# Patient Record
Sex: Female | Born: 1965 | Race: Black or African American | Hispanic: No | Marital: Single | Smoking: Current some day smoker
Health system: Southern US, Community
[De-identification: ages and names within clinical notes are randomized; demographics above are authoritative.]

## PROBLEM LIST (undated history)

## (undated) DIAGNOSIS — B20 Human immunodeficiency virus [HIV] disease: Principal | ICD-10-CM

## (undated) DIAGNOSIS — F339 Major depressive disorder, recurrent, unspecified: Secondary | ICD-10-CM

## (undated) DIAGNOSIS — I10 Essential (primary) hypertension: Secondary | ICD-10-CM

## (undated) DIAGNOSIS — M179 Osteoarthritis of knee, unspecified: Secondary | ICD-10-CM

## (undated) DIAGNOSIS — F3132 Bipolar disorder, current episode depressed, moderate: Secondary | ICD-10-CM

## (undated) DIAGNOSIS — M1710 Unilateral primary osteoarthritis, unspecified knee: Secondary | ICD-10-CM

## (undated) HISTORY — DX: Osteoarthritis of knee, unspecified: M17.9

## (undated) HISTORY — DX: Human immunodeficiency virus (HIV) disease: B20

## (undated) HISTORY — DX: Bipolar disorder, current episode depressed, moderate: F31.32

## (undated) HISTORY — DX: Major depressive disorder, recurrent, unspecified: F33.9

## (undated) HISTORY — DX: Unilateral primary osteoarthritis, unspecified knee: M17.10

## (undated) HISTORY — PX: APPENDECTOMY: SHX54

---

## 2013-04-02 LAB — T-HELPER CELLS (CD4) COUNT (NOT AT ARMC): CD4 ABSOLUTE: 381

## 2013-04-02 LAB — HIV-1 RNA QUANT-NO REFLEX-BLD: HIV 1 RNA Quant: 1640

## 2013-10-09 ENCOUNTER — Ambulatory Visit: Payer: Self-pay

## 2013-12-11 ENCOUNTER — Ambulatory Visit: Payer: Self-pay

## 2013-12-21 ENCOUNTER — Emergency Department (HOSPITAL_COMMUNITY): Payer: Self-pay

## 2013-12-21 ENCOUNTER — Emergency Department (HOSPITAL_COMMUNITY)
Admission: EM | Admit: 2013-12-21 | Discharge: 2013-12-21 | Disposition: A | Discharge status: Home / Self Care | Payer: Self-pay | Attending: Emergency Medicine | Admitting: Emergency Medicine

## 2013-12-21 ENCOUNTER — Encounter (HOSPITAL_COMMUNITY): Payer: Self-pay | Admitting: Emergency Medicine

## 2013-12-21 DIAGNOSIS — M25561 Pain in right knee: Secondary | ICD-10-CM | POA: Insufficient documentation

## 2013-12-21 DIAGNOSIS — M1711 Unilateral primary osteoarthritis, right knee: Secondary | ICD-10-CM | POA: Insufficient documentation

## 2013-12-21 DIAGNOSIS — I10 Essential (primary) hypertension: Secondary | ICD-10-CM | POA: Insufficient documentation

## 2013-12-21 HISTORY — DX: Essential (primary) hypertension: I10

## 2013-12-21 MED ORDER — TRAMADOL HCL 50 MG PO TABS: 50.0000 mg | ORAL_TABLET | Freq: Four times a day (QID) | ORAL | Status: DC | PRN

## 2013-12-21 MED ORDER — MELOXICAM 7.5 MG PO TABS: 7.5000 mg | ORAL_TABLET | Freq: Every day | ORAL | Status: DC

## 2013-12-21 MED ORDER — TRAMADOL HCL 50 MG PO TABS
50.0000 mg | ORAL_TABLET | Freq: Once | ORAL | Status: AC
  Administered 2013-12-21: 50 mg via ORAL
  Filled 2013-12-21: qty 1

## 2013-12-21 NOTE — ED Notes (Signed)
Per PTAR, Pt, from [facility], c/o R knee pain x 2 days.  Pain score 10/10.  Pt denies injury.

## 2013-12-21 NOTE — ED Provider Notes (Signed)
CSN: [id number]     Arrival date & time 12/21/13  1226 History  This chart was scribed for a non-physician practitioner, Terri Piedraourtney Forcucci, PA-C working with Toy BakerAnthony T Allen, MD by [place] Peace, ED Scribe. The patient was seen in WTR6/WTR6. The patient's care was started at 12:54 PM.    Chief Complaint  Patient presents with  . Knee Pain      Patient is a 48 y.o. female presenting with knee pain. The history is provided by the patient. No language interpreter was used.  Knee Pain Associated symptoms: no fever    HPI Comments: Holly Hunt is a 48 y.o. female who presents to the Emergency Department complaining of severe right knee pain onset 2 days ago with associated swelling that has gotten worse since onset. Pt states affected area has been warm to touch. Pain rated as 10/10 and exacerbated with movement and standing. She adds that she limps when she walks now. She notes trying ibuprofen and elevating leg without relief. Pt denies history of arthritis, DM, or joint infections to her knowledge. She further denies fever, chills, nausea, vomiting, or rash.  No similar occurences, no activity change, no injury that may be responsible.    Past Medical History  Diagnosis Date  . Hypertension    History reviewed. No pertinent past surgical history. No family history on file. History  Substance Use Topics  . Smoking status: Not on file  . Smokeless tobacco: Not on file  . Alcohol Use: Not on file   OB History   Grav Para Term Preterm Abortions TAB SAB Ect Mult Living                 Review of Systems  Constitutional: Negative for fever and chills.  Gastrointestinal: Negative for nausea and vomiting.  Musculoskeletal:       Right knee pain   Skin: Negative for pallor.  All other systems reviewed and are negative.     Allergies  Codeine  Home Medications   Prior to Admission medications   Medication Sig Start Date End Date Taking? Authorizing Provider  meloxicam (MOBIC)  7.5 MG tablet Take 1 tablet (7.5 mg total) by mouth daily. 12/21/13   Ann Groeneveld A Forcucci, PA-C  traMADol (ULTRAM) 50 MG tablet Take 1 tablet (50 mg total) by mouth every 6 (six) hours as needed. 12/21/13   Seaira Byus A Forcucci, PA-C   SpO2 98%  LMP 10/28/2013 Physical Exam  Nursing note and vitals reviewed. Constitutional: She is oriented to person, place, and time. She appears well-developed and well-nourished. No distress.  HENT:  Head: Normocephalic and atraumatic.  Mouth/Throat: Oropharynx is clear and moist. No oropharyngeal exudate.  Eyes: Conjunctivae and EOM are normal. Pupils are equal, round, and reactive to light. No scleral icterus.  Neck: Normal range of motion. Neck supple. No JVD present. No thyromegaly present.  Cardiovascular: Normal rate, regular rhythm and normal heart sounds.  Exam reveals no gallop and no friction rub.   No murmur heard. Pulmonary/Chest: Effort normal and breath sounds normal. No respiratory distress. She has no wheezes. She has no rales. She exhibits no tenderness.  Musculoskeletal:       Right knee: She exhibits decreased range of motion, swelling and bony tenderness. She exhibits no effusion, no ecchymosis, no deformity, no laceration, no erythema, normal alignment, no LCL laxity, normal patellar mobility, normal meniscus and no MCL laxity. Tenderness found. Medial joint line and lateral joint line tenderness noted. No MCL, no LCL and no patellar  tendon tenderness noted.  Pain on medial and lateral joint lines.  2+ DP pulses.  No calf tenderness.   Lymphadenopathy:    She has no cervical adenopathy.  Neurological: She is alert and oriented to person, place, and time.  Skin: Skin is warm and dry.  Psychiatric: She has a normal mood and affect. Her behavior is normal. Judgment and thought content normal.    ED Course  Procedures (including critical care time) Labs Review Labs Reviewed - No data to display  No results found for this or any  previous visit. No results found.   Imaging Review Dg Knee Complete 4 Views Right  12/21/2013   CLINICAL DATA:  Right knee pain and swelling for 2-3 days. Pain mostly lateral. No injury.  EXAM: RIGHT KNEE - COMPLETE 4+ VIEW  COMPARISON:  None.  FINDINGS: No fracture.  No bone lesion.  There is mild patellofemoral joint space compartment narrowing. There is marginal osteophyte formation from all 3 compartments, most prominent from the patella and lateral compartment. There is an area of lucency along the dorsal margin of the mid patella suggesting a focus of subchondral cystic change.  A moderate joint effusion is present.  Soft tissues are unremarkable.  IMPRESSION: 1. No fracture.  No dislocation. 2. Arthropathic changes as described. Findings may reflect osteoarthritis. CPPD arthropathy should be considered. 3. Moderate joint effusion.   Electronically Signed   By: Amie Portlandavid  Ormond M.D.   On: 12/21/2013 13:33     EKG Interpretation None     Medications  traMADol (ULTRAM) tablet 50 mg (50 mg Oral Given 12/21/13 1340)    1:01 PM- Treatment plan was discussed with patient who verbalizes understanding and agrees.   MDM   Final diagnoses:  Right knee pain  Primary osteoarthritis of right knee   Patient is a 48 y.o. Female who presents to the ED with right knee pain. Physical exam reveals neurovascularly intact right knee with joint line tenderness.  Plain film xrays reveal moderate arthritis in all compartments.  Patient treated here with tramadol.  Will place the patient on mobic daily and tramadol for pain.  Will refer the patient to ortho.  Patient to return for compartment syndrome and septic joint symptoms.  Patient states understanding and agreement.    I personally performed the services described in this documentation, which was scribed in my presence. The recorded information has been reviewed and is accurate.   Eben Burowourtney A Forcucci, PA-C 12/21/13 1353

## 2013-12-21 NOTE — ED Notes (Signed)
Pt is discharged, bus pass given. Pt is requesting more pain medicine and crutches before leaving. Will notify PA.

## 2013-12-21 NOTE — ED Notes (Signed)
Pt sts she has no way of going back home after discharge, she is requesting assistance with transportation. Explained to pt that, if available we can give her bus pass. Pt got upset and angry stating we should more then that.

## 2013-12-21 NOTE — Discharge Instructions (Signed)
Arthritis, Nonspecific °Arthritis is inflammation of a joint. This usually means pain, redness, warmth or swelling are present. One or more joints may be involved. There are a number of types of arthritis. Your caregiver may not be able to tell what type of arthritis you have right away. °CAUSES  °The most common cause of arthritis is the wear and tear on the joint (osteoarthritis). This causes damage to the cartilage, which can break down over time. The knees, hips, back and neck are most often affected by this type of arthritis. °Other types of arthritis and common causes of joint pain include: °· Sprains and other injuries near the joint. Sometimes minor sprains and injuries cause pain and swelling that develop hours later. °· Rheumatoid arthritis. This affects hands, feet and knees. It usually affects both sides of your body at the same time. It is often associated with chronic ailments, fever, weight loss and general weakness. °· Crystal arthritis. Gout and pseudo gout can cause occasional acute severe pain, redness and swelling in the foot, ankle, or knee. °· Infectious arthritis. Bacteria can get into a joint through a break in overlying skin. This can cause infection of the joint. Bacteria and viruses can also spread through the blood and affect your joints. °· Drug, infectious and allergy reactions. Sometimes joints can become mildly painful and slightly swollen with these types of illnesses. °SYMPTOMS  °· Pain is the main symptom. °· Your joint or joints can also be red, swollen and warm or hot to the touch. °· You may have a fever with certain types of arthritis, or even feel overall ill. °· The joint with arthritis will hurt with movement. Stiffness is present with some types of arthritis. °DIAGNOSIS  °Your caregiver will suspect arthritis based on your description of your symptoms and on your exam. Testing may be needed to find the type of arthritis: °· Blood and sometimes urine tests. °· X-ray tests  and sometimes CT or MRI scans. °· Removal of fluid from the joint (arthrocentesis) is done to check for bacteria, crystals or other causes. Your caregiver (or a specialist) will numb the area over the joint with a local anesthetic, and use a needle to remove joint fluid for examination. This procedure is only minimally uncomfortable. °· Even with these tests, your caregiver may not be able to tell what kind of arthritis you have. Consultation with a specialist (rheumatologist) may be helpful. °TREATMENT  °Your caregiver will discuss with you treatment specific to your type of arthritis. If the specific type cannot be determined, then the following general recommendations may apply. °Treatment of severe joint pain includes: °· Rest. °· Elevation. °· Anti-inflammatory medication (for example, ibuprofen) may be prescribed. Avoiding activities that cause increased pain. °· Only take over-the-counter or prescription medicines for pain and discomfort as recommended by your caregiver. °· Cold packs over an inflamed joint may be used for 10 to 15 minutes every hour. Hot packs sometimes feel better, but do not use overnight. Do not use hot packs if you are diabetic without your caregiver's permission. °· A cortisone shot into arthritic joints may help reduce pain and swelling. °· Any acute arthritis that gets worse over the next 1 to 2 days needs to be looked at to be sure there is no joint infection. °Long-term arthritis treatment involves modifying activities and lifestyle to reduce joint stress jarring. This can include weight loss. Also, exercise is needed to nourish the joint cartilage and remove waste. This helps keep the muscles   around the joint strong. °HOME CARE INSTRUCTIONS  °· Do not take aspirin to relieve pain if gout is suspected. This elevates uric acid levels. °· Only take over-the-counter or prescription medicines for pain, discomfort or fever as directed by your caregiver. °· Rest the joint as much as  possible. °· If your joint is swollen, keep it elevated. °· Use crutches if the painful joint is in your leg. °· Drinking plenty of fluids may help for certain types of arthritis. °· Follow your caregiver's dietary instructions. °· Try low-impact exercise such as: °¨ Swimming. °¨ Water aerobics. °¨ Biking. °¨ Walking. °· Morning stiffness is often relieved by a warm shower. °· Put your joints through regular range-of-motion. °SEEK MEDICAL CARE IF:  °· You do not feel better in 24 hours or are getting worse. °· You have side effects to medications, or are not getting better with treatment. °SEEK IMMEDIATE MEDICAL CARE IF:  °· You have a fever. °· You develop severe joint pain, swelling or redness. °· Many joints are involved and become painful and swollen. °· There is severe back pain and/or leg weakness. °· You have loss of bowel or bladder control. °Document Released: 03/23/2004 Document Revised: 05/08/2011 Document Reviewed: 04/08/2008 °ExitCare® Patient Information ©2015 [employer]. This information is not intended to replace advice given to you by your health care provider. Make sure you discuss any questions you have with your health care provider. ° °

## 2013-12-24 NOTE — ED Provider Notes (Signed)
Medical screening examination/treatment/procedure(s) were performed by non-physician practitioner and as supervising physician I was immediately available for consultation/collaboration.   Toy BakerAnthony T Shepard Keltz, MD 12/24/13 1019

## 2014-01-27 ENCOUNTER — Ambulatory Visit: Payer: Self-pay

## 2014-03-10 ENCOUNTER — Encounter: Payer: Self-pay | Admitting: *Deleted

## 2014-03-10 NOTE — Progress Notes (Signed)
Patient ID: Holly CaroliCheryl Hunt, female   DOB: 1966-01-01, 49 y.o.   MRN: [id number] Ct [facility] w/ Genieve Ramaswamy @ [facility].  CM unable to contact ct since 12/19/13.  [facility] due to inability to contact.

## 2014-05-27 ENCOUNTER — Telehealth: Payer: Self-pay

## 2014-05-27 NOTE — Telephone Encounter (Signed)
Multiple attempts to reach patient since 09-2013.  Multiple no shows.  Phone number is not valid.   Bridge Counselor referral made.   Laurell Josephsammy K Janecia Palau, RN

## 2014-05-28 ENCOUNTER — Telehealth: Payer: Self-pay

## 2014-05-28 NOTE — Telephone Encounter (Signed)
[facility] to [facility] for intake.  She tested positive for HIV at their facility.   Detention center informed this is not a new diagnosis.  I have been trying to contact patient for at least 2 years.  They are unsure how long she will be there.  Appointment given for intake.      Laurell Josephsammy K King, RN

## 2014-06-16 ENCOUNTER — Ambulatory Visit: Payer: PRIVATE HEALTH INSURANCE

## 2014-06-16 DIAGNOSIS — B20 Human immunodeficiency virus [HIV] disease: Secondary | ICD-10-CM

## 2014-06-16 DIAGNOSIS — F3162 Bipolar disorder, current episode mixed, moderate: Secondary | ICD-10-CM

## 2014-06-16 DIAGNOSIS — A630 Anogenital (venereal) warts: Secondary | ICD-10-CM

## 2014-06-16 DIAGNOSIS — M25561 Pain in right knee: Secondary | ICD-10-CM

## 2014-06-16 DIAGNOSIS — I10 Essential (primary) hypertension: Secondary | ICD-10-CM

## 2014-06-16 DIAGNOSIS — R768 Other specified abnormal immunological findings in serum: Secondary | ICD-10-CM

## 2014-06-17 LAB — CBC WITH DIFFERENTIAL/PLATELET
BASOS PCT: 1 % (ref 0–1)
Basophils Absolute: 0 10*3/uL (ref 0.0–0.1)
EOS ABS: 0.3 10*3/uL (ref 0.0–0.7)
Eosinophils Relative: 6 % — ABNORMAL HIGH (ref 0–5)
HEMATOCRIT: 34.4 % — AB (ref 36.0–46.0)
Hemoglobin: 11.4 g/dL — ABNORMAL LOW (ref 12.0–15.0)
Lymphocytes Relative: 46 % (ref 12–46)
Lymphs Abs: 2.2 10*3/uL (ref 0.7–4.0)
MCH: 28.7 pg (ref 26.0–34.0)
MCHC: 33.1 g/dL (ref 30.0–36.0)
MCV: 86.6 fL (ref 78.0–100.0)
MONOS PCT: 10 % (ref 3–12)
MPV: 9.2 fL (ref 8.6–12.4)
Monocytes Absolute: 0.5 10*3/uL (ref 0.1–1.0)
Neutro Abs: 1.8 10*3/uL (ref 1.7–7.7)
Neutrophils Relative %: 37 % — ABNORMAL LOW (ref 43–77)
Platelets: 306 10*3/uL (ref 150–400)
RBC: 3.97 MIL/uL (ref 3.87–5.11)
RDW: 14.1 % (ref 11.5–15.5)
WBC: 4.8 10*3/uL (ref 4.0–10.5)

## 2014-06-17 LAB — URINALYSIS
Bilirubin Urine: NEGATIVE
Glucose, UA: NEGATIVE mg/dL
Hgb urine dipstick: NEGATIVE
KETONES UR: NEGATIVE mg/dL
Leukocytes, UA: NEGATIVE
Nitrite: NEGATIVE
PH: 5.5 (ref 5.0–8.0)
PROTEIN: NEGATIVE mg/dL
SPECIFIC GRAVITY, URINE: 1.01 (ref 1.005–1.030)
UROBILINOGEN UA: 0.2 mg/dL (ref 0.0–1.0)

## 2014-06-17 LAB — COMPLETE METABOLIC PANEL WITH GFR
ALK PHOS: 34 U/L — AB (ref 39–117)
ALT: 31 U/L (ref 0–35)
AST: 24 U/L (ref 0–37)
Albumin: 3.4 g/dL — ABNORMAL LOW (ref 3.5–5.2)
BUN: 15 mg/dL (ref 6–23)
CO2: 25 mEq/L (ref 19–32)
CREATININE: 0.69 mg/dL (ref 0.50–1.10)
Calcium: 9 mg/dL (ref 8.4–10.5)
Chloride: 103 mEq/L (ref 96–112)
Glucose, Bld: 94 mg/dL (ref 70–99)
Potassium: 4.3 mEq/L (ref 3.5–5.3)
Sodium: 135 mEq/L (ref 135–145)
Total Bilirubin: 0.3 mg/dL (ref 0.2–1.2)
Total Protein: 9.4 g/dL — ABNORMAL HIGH (ref 6.0–8.3)

## 2014-06-17 LAB — LIPID PANEL
CHOL/HDL RATIO: 4 ratio
Cholesterol: 145 mg/dL (ref 0–200)
HDL: 36 mg/dL — AB (ref >=46)
LDL CALC: 72 mg/dL (ref 0–99)
TRIGLYCERIDES: 184 mg/dL — AB (ref <150)
VLDL: 37 mg/dL (ref 0–40)

## 2014-06-17 LAB — HEPATITIS B SURFACE ANTIGEN: HEP B S AG: NEGATIVE

## 2014-06-17 LAB — URINE CYTOLOGY ANCILLARY ONLY
Chlamydia: NEGATIVE
Neisseria Gonorrhea: NEGATIVE

## 2014-06-17 LAB — HEPATITIS B CORE ANTIBODY, TOTAL: Hep B Core Total Ab: REACTIVE — AB

## 2014-06-17 LAB — RPR

## 2014-06-17 LAB — HEPATITIS B SURFACE ANTIBODY,QUALITATIVE: Hep B S Ab: NEGATIVE

## 2014-06-17 LAB — T-HELPER CELL (CD4) - (RCID CLINIC ONLY)
CD4 T CELL HELPER: 23 % — AB (ref 33–55)
CD4 T Cell Abs: 520 /uL (ref 400–2700)

## 2014-06-17 LAB — HEPATITIS A ANTIBODY, TOTAL: Hep A Total Ab: REACTIVE — AB

## 2014-06-17 LAB — HEPATITIS C ANTIBODY: HCV AB: NEGATIVE

## 2014-06-18 LAB — HIV-1 RNA ULTRAQUANT REFLEX TO GENTYP+
HIV 1 RNA Quant: 3413 copies/mL — ABNORMAL HIGH (ref <20)
HIV-1 RNA Quant, Log: 3.53 {Log} — ABNORMAL HIGH (ref <1.30)

## 2014-06-19 DIAGNOSIS — I10 Essential (primary) hypertension: Secondary | ICD-10-CM | POA: Insufficient documentation

## 2014-06-19 DIAGNOSIS — M25561 Pain in right knee: Secondary | ICD-10-CM | POA: Insufficient documentation

## 2014-06-19 DIAGNOSIS — A630 Anogenital (venereal) warts: Secondary | ICD-10-CM | POA: Insufficient documentation

## 2014-06-19 DIAGNOSIS — R768 Other specified abnormal immunological findings in serum: Secondary | ICD-10-CM | POA: Insufficient documentation

## 2014-06-19 LAB — QUANTIFERON TB GOLD ASSAY (BLOOD)
Interferon Gamma Release Assay: NEGATIVE
Mitogen value: 6.36 IU/mL
Quantiferon Nil Value: 0.06 IU/mL
Quantiferon Tb Ag Minus Nil Value: 0 IU/mL
TB Ag value: 0.06 IU/mL

## 2014-06-19 NOTE — Progress Notes (Signed)
[facility]  She has been HIV positive since 2010 and has been without HAART Since February, 2015.  She is having continued right knee pain and states she has been told she needs surgery. She is taking tylenol for the pain but this is not working.  She  also has wart around her anal area that are causing great pain. She says the detention center physician says they are not able to help with rectal warts.  When she is released she will assistance with housing and orthopedic referral.    [facility]  Vaccines will need to be updated at next visit.   Laurell Josephsammy K King, RN

## 2014-06-23 LAB — HLA B*5701: HLA-B*5701 w/rflx HLA-B High: NEGATIVE

## 2014-06-26 LAB — HIV-1 GENOTYPR PLUS

## 2014-07-07 ENCOUNTER — Ambulatory Visit (INDEPENDENT_AMBULATORY_CARE_PROVIDER_SITE_OTHER): Payer: PRIVATE HEALTH INSURANCE | Admitting: Infectious Disease

## 2014-07-07 ENCOUNTER — Encounter: Payer: Self-pay | Admitting: Infectious Disease

## 2014-07-07 VITALS — BP 106/68 | HR 85 | Temp 98.0°F | Ht 64.0 in | Wt 248.0 lb

## 2014-07-07 DIAGNOSIS — M170 Bilateral primary osteoarthritis of knee: Secondary | ICD-10-CM

## 2014-07-07 DIAGNOSIS — R894 Abnormal immunological findings in specimens from other organs, systems and tissues: Secondary | ICD-10-CM

## 2014-07-07 DIAGNOSIS — M25561 Pain in right knee: Secondary | ICD-10-CM

## 2014-07-07 DIAGNOSIS — F330 Major depressive disorder, recurrent, mild: Secondary | ICD-10-CM

## 2014-07-07 DIAGNOSIS — Z23 Encounter for immunization: Secondary | ICD-10-CM | POA: Diagnosis not present

## 2014-07-07 DIAGNOSIS — M179 Osteoarthritis of knee, unspecified: Secondary | ICD-10-CM | POA: Insufficient documentation

## 2014-07-07 DIAGNOSIS — B20 Human immunodeficiency virus [HIV] disease: Secondary | ICD-10-CM

## 2014-07-07 DIAGNOSIS — A630 Anogenital (venereal) warts: Secondary | ICD-10-CM

## 2014-07-07 DIAGNOSIS — F339 Major depressive disorder, recurrent, unspecified: Secondary | ICD-10-CM | POA: Insufficient documentation

## 2014-07-07 DIAGNOSIS — I10 Essential (primary) hypertension: Secondary | ICD-10-CM

## 2014-07-07 DIAGNOSIS — R768 Other specified abnormal immunological findings in serum: Secondary | ICD-10-CM

## 2014-07-07 DIAGNOSIS — M1710 Unilateral primary osteoarthritis, unspecified knee: Secondary | ICD-10-CM | POA: Insufficient documentation

## 2014-07-07 HISTORY — DX: Human immunodeficiency virus (HIV) disease: B20

## 2014-07-07 HISTORY — DX: Major depressive disorder, recurrent, unspecified: F33.9

## 2014-07-07 MED ORDER — IMIQUIMOD 5 % EX CREA: TOPICAL_CREAM | CUTANEOUS | Status: AC

## 2014-07-07 MED ORDER — EMTRICITABINE-TENOFOVIR DF 200-300 MG PO TABS: 1.0000 | ORAL_TABLET | Freq: Every day | ORAL | Status: DC

## 2014-07-07 MED ORDER — DARUNAVIR-COBICISTAT 800-150 MG PO TABS: 1.0000 | ORAL_TABLET | Freq: Every day | ORAL | Status: DC

## 2014-07-07 NOTE — Progress Notes (Signed)
Subjective:    Patient ID: Holly Hunt, female    DOB: [birth year], 49 y.o.   MRN: [id number]  HPI  [name] is a 49 year old African American lady who was diagnosed with HIV in 2010 and started on on meds in  2011, on Prezista, norvir, and Truvada. She has not been consistently in care recently. She has now been incarcerated and is in jail but expects to be out of jail by May 27. She is brought to our clinic after referral from the prison system.  She still is a healthy CD4 count but not controlled viremia given that she is not on antiretrovirals.  She comes today to the clinic accompanied by prison guard and is concerned about worsening warts in her perianal region.  She is more than happy to go back on a similar regimen to her initial protease based regimen and we will place her on PREZCOBIX and Truvada.   Review of Systems  Constitutional: Negative for fever, chills, diaphoresis, activity change, appetite change, fatigue and unexpected weight change.  HENT: Negative for congestion, rhinorrhea, sinus pressure, sneezing, sore throat and trouble swallowing.   Eyes: Negative for photophobia and visual disturbance.  Respiratory: Negative for cough, chest tightness, shortness of breath, wheezing and stridor.   Cardiovascular: Negative for chest pain, palpitations and leg swelling.  Gastrointestinal: Positive for anal bleeding. Negative for nausea, vomiting, abdominal pain, diarrhea, constipation, blood in stool and abdominal distention.  Genitourinary: Negative for dysuria, hematuria, flank pain and difficulty urinating.  Musculoskeletal: Negative for myalgias, back pain, joint swelling, arthralgias and gait problem.  Skin: Negative for color change, pallor, rash and wound.  Neurological: Negative for dizziness, tremors, weakness and light-headedness.  Hematological: Negative for adenopathy. Does not bruise/bleed easily.  Psychiatric/Behavioral: Negative for behavioral problems, confusion,  sleep disturbance, dysphoric mood, decreased concentration and agitation.       Objective:   Physical Exam  Constitutional: She is oriented to person, place, and time. She appears well-developed and well-nourished. No distress.  HENT:  Head: Normocephalic and atraumatic.  Mouth/Throat: No oropharyngeal exudate.  Eyes: Conjunctivae and EOM are normal. No scleral icterus.  Neck: Normal range of motion. Neck supple.  Cardiovascular: Normal rate and regular rhythm.   Pulmonary/Chest: Effort normal. No respiratory distress. She has no wheezes.  Abdominal: She exhibits no distension.  Musculoskeletal: She exhibits no edema or tenderness.  Neurological: She is alert and oriented to person, place, and time. She exhibits normal muscle tone. Coordination normal.  Skin: Skin is warm and dry. No rash noted. She is not diaphoretic. No erythema. No pallor.  Psychiatric: She has a normal mood and affect. Her behavior is normal. Judgment and thought content normal.    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 When she is out of jail she needs to come to this clinic as soon as possible to get placed on the aids drug assistance program. She believes she will get a 30 day supply of antiretroviral's the prison system prior to discharge. We will recheck her blood work 1 month after starting this regimen.  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

## 2014-08-04 ENCOUNTER — Other Ambulatory Visit: Payer: Self-pay | Admitting: *Deleted

## 2014-08-04 DIAGNOSIS — B20 Human immunodeficiency virus [HIV] disease: Secondary | ICD-10-CM

## 2014-08-04 MED ORDER — DARUNAVIR-COBICISTAT 800-150 MG PO TABS: 1.0000 | ORAL_TABLET | Freq: Every day | ORAL | Status: DC

## 2014-08-04 MED ORDER — EMTRICITABINE-TENOFOVIR DF 200-300 MG PO TABS: 1.0000 | ORAL_TABLET | Freq: Every day | ORAL | Status: DC

## 2014-08-04 MED ORDER — EMTRICITABINE-TENOFOVIR DF 200-300 MG PO TABS
1.0000 | ORAL_TABLET | Freq: Every day | ORAL | Status: DC
Start: 2014-08-04 — End: 2014-08-04

## 2014-08-07 ENCOUNTER — Other Ambulatory Visit: Payer: Self-pay | Admitting: *Deleted

## 2014-08-07 DIAGNOSIS — B20 Human immunodeficiency virus [HIV] disease: Secondary | ICD-10-CM

## 2014-08-07 MED ORDER — DARUNAVIR-COBICISTAT 800-150 MG PO TABS: 1.0000 | ORAL_TABLET | Freq: Every day | ORAL | Status: DC

## 2014-08-07 MED ORDER — EMTRICITABINE-TENOFOVIR DF 200-300 MG PO TABS: 1.0000 | ORAL_TABLET | Freq: Every day | ORAL | Status: DC

## 2014-08-07 NOTE — Telephone Encounter (Signed)
ADAP Application 

## 2014-08-11 ENCOUNTER — Ambulatory Visit: Payer: PRIVATE HEALTH INSURANCE

## 2014-08-13 ENCOUNTER — Emergency Department (HOSPITAL_COMMUNITY)
Admission: EM | Admit: 2014-08-13 | Discharge: 2014-08-13 | Disposition: A | Discharge status: Home / Self Care | Payer: PRIVATE HEALTH INSURANCE | Attending: Emergency Medicine | Admitting: Emergency Medicine

## 2014-08-13 ENCOUNTER — Encounter (HOSPITAL_COMMUNITY): Payer: Self-pay | Admitting: Emergency Medicine

## 2014-08-13 DIAGNOSIS — B20 Human immunodeficiency virus [HIV] disease: Secondary | ICD-10-CM

## 2014-08-13 DIAGNOSIS — F329 Major depressive disorder, single episode, unspecified: Secondary | ICD-10-CM | POA: Insufficient documentation

## 2014-08-13 DIAGNOSIS — Z79899 Other long term (current) drug therapy: Secondary | ICD-10-CM | POA: Insufficient documentation

## 2014-08-13 DIAGNOSIS — Z21 Asymptomatic human immunodeficiency virus [HIV] infection status: Secondary | ICD-10-CM | POA: Insufficient documentation

## 2014-08-13 DIAGNOSIS — Z87891 Personal history of nicotine dependence: Secondary | ICD-10-CM | POA: Insufficient documentation

## 2014-08-13 DIAGNOSIS — M170 Bilateral primary osteoarthritis of knee: Secondary | ICD-10-CM | POA: Insufficient documentation

## 2014-08-13 DIAGNOSIS — M25562 Pain in left knee: Secondary | ICD-10-CM | POA: Insufficient documentation

## 2014-08-13 DIAGNOSIS — M25561 Pain in right knee: Secondary | ICD-10-CM

## 2014-08-13 DIAGNOSIS — I10 Essential (primary) hypertension: Secondary | ICD-10-CM | POA: Insufficient documentation

## 2014-08-13 MED ORDER — NAPROXEN 250 MG PO TABS
500.0000 mg | ORAL_TABLET | Freq: Once | ORAL | Status: AC
  Administered 2014-08-13: 500 mg via ORAL
  Filled 2014-08-13: qty 2

## 2014-08-13 MED ORDER — DICLOFENAC SODIUM 1 % TD GEL: 2.0000 g | Freq: Four times a day (QID) | TRANSDERMAL | Status: AC | PRN

## 2014-08-13 MED ORDER — NAPROXEN 500 MG PO TABS: 500.0000 mg | ORAL_TABLET | Freq: Two times a day (BID) | ORAL | Status: AC

## 2014-08-13 NOTE — ED Provider Notes (Signed)
CSN: [id number]     Arrival date & time 08/13/14  1529 History  This chart was scribed for Dierdre Forth, PA-C working with Glynn Octave, MD by Evon Slack, ED Scribe. This patient was seen in room TR01C/TR01C and the patient's care was started at 4:17 PM.     Chief Complaint  Patient presents with  . Joint Swelling    The patient said both of her knees hurt and they are swollen.  She has taken tramadol that she was given here at [facility].  she said it is not helping.    The history is provided by the patient and medical records. No language interpreter was used.  HPI Comments: Holly Hunt is a 49 y.o. female who presents to the Emergency Department complaining of bilateral knee pain. She states that she has had pain in the left knee since December 2015. She states that she has new pain in the right knee that began 2 weeks ago. She states she has associated bilateral knee swelling as well. Pt denies any medications PTA. Pt states states that she has received cortisone injections the left knee that only provided temporary relief. Pt states that the pain is worse when ambulating. Pt denies fall or injury. Pt does report numbness, tinging, fever, nausea or vomiting.  Patient with a history of HIV.  She re-established care on 06/16/2014.  She was placed on Truvada and continues to follow with the [facility]. At that time her CD4 count was 520 and her HIV count was 3413.  She denies systemic symptoms including fever or chills, nausea or vomiting. She reports being compliant with her medications.   Past Medical History  Diagnosis Date  . Hypertension   . HIV disease 07/07/2014  . Osteoarthritis, knee   . 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 Appendectomy    . Cesarean section     Family History  Problem Relation Age of Onset  . Hypertension Mother   . COPD Father    History  Substance Use Topics  . Smoking status:  Former Smoker -- 0.50 packs/day for 30 years    Types: Cigarettes    Quit date: 02/27/2014  . Smokeless tobacco: Never Used  . Alcohol Use: No   OB History    Gravida Para Term Preterm AB TAB SAB Ectopic Multiple Living   Review of Systems  Constitutional: Negative for fever and chills.  Gastrointestinal: Negative for nausea and vomiting.  Musculoskeletal: Positive for joint swelling and arthralgias. Negative for back pain, gait problem, neck pain and neck stiffness.  Skin: Negative for wound.  Neurological: Negative for numbness.  Hematological: Does not bruise/bleed easily.  Psychiatric/Behavioral: The patient is not nervous/anxious.   All other systems reviewed and are negative.   Allergies  Codeine  Home Medications   Prior to Admission medications   Medication Sig Start Date End Date Taking? Authorizing Provider  benazepril-hydrochlorthiazide (LOTENSIN HCT) 20-12.5 MG per tablet Take 1 tablet by mouth daily.    Historical Provider, MD  citalopram (CELEXA) 20 MG tablet Take 20 mg by mouth daily.    Historical Provider, MD  darunavir-cobicistat (PREZCOBIX) 800-150 MG per tablet Take 1 tablet by mouth daily with lunch. 08/07/14   Gardiner Barefoot, MD  diclofenac sodium (VOLTAREN) 1 % GEL Apply 2 g topically 4 (four) times daily as needed. To your knees 08/13/14  Anival Pasha, PA-C  emtricitabine-tenofovir (TRUVADA) 200-300 MG per tablet Take 1 tablet by mouth daily. 08/07/14   Gardiner Barefoot, MD  hydrOXYzine (VISTARIL) 100 MG capsule Take 100 mg by mouth at bedtime.    Historical Provider, MD  imiquimod (ALDARA) 5 % cream Apply topically 3 (three) times a week. 07/07/14   Randall Hiss, MD  lisinopril (PRINIVIL,ZESTRIL) 20 MG tablet Take 20 mg by mouth daily.    Historical Provider, MD  naproxen (NAPROSYN) 500 MG tablet Take 1 tablet (500 mg total) by mouth 2 (two) times daily with a meal. 08/13/14   Lanisha Stepanian, PA-C  traMADol (ULTRAM) 50 MG  tablet Take 1 tablet (50 mg total) by mouth every 6 (six) hours as needed. 12/21/13   Courtney Forcucci, PA-C   BP 139/86 mmHg  Pulse 69  Temp(Src) 98.2 F (36.8 C) (Oral)  Resp 16  Ht 5\' 4"  (1.626 m)  Wt 250 lb (113.399 kg)  BMI 42.89 kg/m2  SpO2 96%  LMP 03/14/2014 (LMP Unknown)   Physical Exam  Constitutional: She appears well-developed and well-nourished. No distress.  HENT:  Head: Normocephalic and atraumatic.  Eyes: Conjunctivae are normal.  Neck: Normal range of motion.  Cardiovascular: Normal rate, regular rhythm and intact distal pulses.   Capillary refill < 3 sec  Pulmonary/Chest: Effort normal and breath sounds normal.  Musculoskeletal: She exhibits tenderness. She exhibits no edema.  ROM: full ROM of bilateral knees with mild pain and palpable crepitus No deformity ecchymosis or abnormal patella movement moderate joint effusion bilateral with out erythema or increased warmth  Normal gait and pt able to weight bear without difficulty  Neurological: She is alert. Coordination normal.  Sensation intact to dull and sharp Strength 5/5 in bilateral lower extremities  Skin: Skin is warm and dry. She is not diaphoretic.  No tenting of the skin  Psychiatric: She has a normal mood and affect.  Nursing note and vitals reviewed.   ED Course  Procedures (including critical care time) DIAGNOSTIC STUDIES: Oxygen Saturation is 98% on RA, normal by my interpretation.    COORDINATION OF CARE: 4:27 PM-Discussed treatment plan with pt at bedside and pt agreed to plan.     Labs Review Labs Reviewed - No data to display  Imaging Review No results found.   EKG Interpretation None      MDM   Final diagnoses:  Arthralgia of knee, left  Arthralgia of knee, right  HIV disease  Osteoarthritis of both knees, unspecified osteoarthritis type    Holly Hunt presents with neck and bilateral knee pain. She recently began a new job which has worsened the pain.  Patient was  given tramadol in December 2015 but is no longer taking this. She has not attempted to take anything for her pain.  Exam is reassuring and there is no clinical evidence of septic joint, trauma or gout. Patient will begin taking naproxen and using diclofenac gel until [facility].  Discussed this location choice with pharmacy who feels that it is acceptable to combine naproxen with Truvada though there is a slight increase in risk for kidney damage. Patient's kidney status is good at this time. Recommend that patient have repeat blood work at her follow-up appointment to check kidney function.   BP 139/86 mmHg  Pulse 69  Temp(Src) 98.2 F (36.8 C) (Oral)  Resp 16  Ht 5\' 4"  (1.626 m)  Wt 250 lb (113.399 kg)  BMI 42.89  kg/m2  SpO2 96%  LMP 03/14/2014 (LMP Unknown)  I personally performed the services described in this documentation, which was scribed in my presence. The recorded information has been reviewed and is accurate.   Dahlia Client Corey Caulfield, PA-C 08/13/14 1655  Glynn Octave, MD 08/13/14 [phone]

## 2014-08-13 NOTE — ED Notes (Signed)
Pt given a warm blanket 

## 2014-08-13 NOTE — Discharge Instructions (Signed)
1. Medications: naprosyn, diclofenac, usual home medications 2. Treatment: rest, drink plenty of fluids, gentle stretching,  3. Follow Up: Please followup with the [facility] as soon as possible for discussion of your diagnoses and further evaluation after today's visit; if you do not have a primary care doctor use the resource guide provided to find one  Arthralgia Your caregiver has diagnosed you as suffering from an arthralgia. Arthralgia means there is pain in a joint. This can come from many reasons including:  Bruising the joint which causes soreness (inflammation) in the joint.  Wear and tear on the joints which occur as we grow older (osteoarthritis).  Overusing the joint.  Various forms of arthritis.  Infections of the joint. Regardless of the cause of pain in your joint, most of these different pains respond to anti-inflammatory drugs and rest. The exception to this is when a joint is infected, and these cases are treated with antibiotics, if it is a bacterial infection. HOME CARE INSTRUCTIONS   Rest the injured area for as long as directed by your caregiver. Then slowly start using the joint as directed by your caregiver and as the pain allows. Crutches as directed may be useful if the ankles, knees or hips are involved. If the knee was splinted or casted, continue use and care as directed. If an stretchy or elastic wrapping bandage has been applied today, it should be removed and re-applied every 3 to 4 hours. It should not be applied tightly, but firmly enough to keep swelling down. Watch toes and feet for swelling, bluish discoloration, coldness, numbness or excessive pain. If any of these problems (symptoms) occur, remove the ace bandage and re-apply more loosely. If these symptoms persist, contact your caregiver or return to this location.  For the first 24 hours, keep the injured extremity elevated on pillows while lying down.  Apply ice for 15-20 minutes to the sore joint  every couple hours while awake for the first half day. Then 03-04 times per day for the first 48 hours. Put the ice in a plastic bag and place a towel between the bag of ice and your skin.  Wear any splinting, casting, elastic bandage applications, or slings as instructed.  Only take over-the-counter or prescription medicines for pain, discomfort, or fever as directed by your caregiver. Do not use aspirin immediately after the injury unless instructed by your physician. Aspirin can cause increased bleeding and bruising of the tissues.  If you were given crutches, continue to use them as instructed and do not resume weight bearing on the sore joint until instructed. Persistent pain and inability to use the sore joint as directed for more than 2 to 3 days are warning signs indicating that you should see a caregiver for a follow-up visit as soon as possible. Initially, a hairline fracture (break in bone) may not be evident on X-rays. Persistent pain and swelling indicate that further evaluation, non-weight bearing or use of the joint (use of crutches or slings as instructed), or further X-rays are indicated. X-rays may sometimes not show a small fracture until a week or 10 days later. Make a follow-up appointment with your own caregiver or one to whom we have referred you. A radiologist (specialist in reading X-rays) may read your X-rays. Make sure you know how you are to obtain your X-ray results. Do not assume everything is normal if you do not hear from [place]. SEEK MEDICAL CARE IF: Bruising, swelling, or pain increases. SEEK IMMEDIATE MEDICAL CARE IF:  Your fingers or toes are numb or blue.  The pain is not responding to medications and continues to stay the same or get worse.  The pain in your joint becomes severe.  You develop a fever over 102 F (38.9 C).  It becomes impossible to move or use the joint. MAKE SURE YOU:   Understand these instructions.  Will watch your condition.  Will get  help right away if you are not doing well or get worse. Document Released: 02/13/2005 Document Revised: 05/08/2011 Document Reviewed: 10/02/2007 [facility] Patient Information 2015 [place], [place]. This information is not intended to replace advice given to you by your health care provider. Make sure you discuss any questions you have with your health care provider.   Emergency Department Resource Guide 1) Find a Doctor and Pay Out of Pocket Although you won't have to find out who is covered by your insurance plan, it is a good idea to ask around and get recommendations. You will then need to call the office and see if the doctor you have chosen will accept you as a new patient and what types of options they offer for patients who are self-pay. Some doctors offer discounts or will set up payment plans for their patients who do not have insurance, but you will need to ask so you aren't surprised when you get to your appointment.  2) Contact Your Local Health Department Not all health departments have doctors that can see patients for sick visits, but many do, so it is worth a call to see if yours does. If you don't know where your local health department is, you can check in your phone book. The CDC also has a tool to help you locate your state's health department, and many state websites also have listings of all of their local health departments.  3) Find a Walk-in Clinic If your illness is not likely to be very severe or complicated, you may want to try a walk in clinic. These are popping up all over the country in pharmacies, drugstores, and shopping centers. They're usually staffed by nurse practitioners or physician assistants that have been trained to treat common illnesses and complaints. They're usually fairly quick and inexpensive. However, if you have serious medical issues or chronic medical problems, these are probably not your best option.  No Primary Care Doctor: - Call Health Connect at   [phone] - they can help you locate a primary care doctor that  accepts your insurance, provides certain services, etc. - Physician Referral Service- [phone]  Chronic Pain Problems: Organization         Address  Phone   Notes  [facility]  [phone] Patients need to be referred by their primary care doctor.   Medication Assistance: Organization         Address  Phone   Notes  [facility] Medication [facility] [address] [place] [place], [place] [address] [phone] --Must be a resident of [facility] -- Must have NO insurance coverage whatsoever (no Medicaid/ Medicare, etc.) -- The pt. MUST have a primary care doctor that directs their care regularly and follows them in the community   MedAssist  [phone]   [employer]  [phone]    Agencies that provide inexpensive medical care: Organization         Address  Phone   Notes  [facility]  [phone]   [facility]    (  [phone]) [phone]   [facility] [address] [place], [place] [address] [phone]   [facility] [address], [place] [phone]   Planned Parenthood    [phone](336) [phone]   [facility]    [phone]   [facility]  [address], [place] Phone:  [phone](336) [phone], Fax:  [phone] Hours of Operation:  9 am - 6 pm, M-F.  Also accepts Medicaid/Medicare and self-pay.  [facility]  [address], [place] Phone: [phone](336) [phone], Fax: [phone]) [phone]. Hours of Operation:  8:30 am - 5:30 pm, M-F.  Also accepts Medicaid and self-pay.  [facility] [place] [address], [place] Phone: [phone](336) [phone]   [facility] [address], [place], [place] [phone], Ext. [phone] Mondays & Thursdays: 7-9 AM.  First 15 patients are seen on a first come, first serve basis.     Medicaid-accepting [facility] Providers:  Organization         Address  Phone   Notes  [facility] [address], [place] [phone] Also accepts self-pay patients.  [facility] [address], [place]  [phone]) [phone]   [facility] [address], [place] [phone](336) [phone]   [facility] [address] [place] [address], [place] [phone](336) [phone]   Renaye RakersVeita Bland [address], [place]   [phone]) [phone] Only accepts [place] Access [place] patients after they have their name applied to their card.   Self-Pay (no insurance) in [facility]:  Organization         Address  Phone   Notes  Sickle Cell Patients, [facility] [address], [place] [phone]) [phone]   [facility] [address], [place] [phone]   [facility]  [address], [place] [phone]) [phone]   [facility]/Dr. Osei-Bonsu  [address] [place] [address], [place] or [phone], [place] [phone](336) [phone] Phone number for both [place] and [place] locations is the same.  [facility] [address], [place] [phone]   [facility] [place] [address] [place] [address], [place] or [address] [phone]) [phone]   [facility] [address], [place] [phone](336) [phone], phone; [phone]) [phone], fax Sees patients 1st and 3rd Saturday of every month.  Must not qualify for public or private insurance (i.e. Medicaid, Medicare, [place] Health Choice, Veterans' Benefits)  Household income should be no more than 200% of the poverty level The clinic cannot treat you if you are pregnant or think you are pregnant  Sexually transmitted diseases are not treated at the clinic.    Dental Care: Organization         Address  Phone  Notes  [facility]  Department of [facility] [address], [place] [phone]) [phone] Accepts children up to age 49 who are enrolled in [place] or  Health Choice; pregnant women with a Medicaid card; and children who have applied for Medicaid or  Health Choice, but were declined, whose parents can pay a reduced fee at time of service.  [facility] Department of [facility] [place]  [address], [place] [phone]) [phone] Accepts children up  to age 5 who are enrolled in Medicaid or [place] Health Choice; pregnant women with a Medicaid card; and children who have applied for Medicaid or [place] Health Choice, but were declined, whose parents can pay a reduced fee at time of service.  [facility]  [address] [place], [place] [phone] Patients are seen by appointment only. Walk-ins are not accepted. [facility] will see patients 96 years of age and older. Monday - Tuesday (8am-5pm) Most Wednesdays (8:30-5pm) $30 per visit, cash only  [facility] Adult Dental Access PROGRAM  [address], [facility] [phone] Patients are seen by appointment only. Walk-ins are not accepted. [facility] will see patients 85 years of age and older. One Wednesday Evening (Monthly: Volunteer Based).  $30 per visit, cash only  [employer]  [phone] for adults; Children under age 86, call Graduate Pediatric Dentistry at [phone]. Children aged 67-14, please call [phone] to request a pediatric application.  Dental services are provided in all areas of dental care including fillings, crowns and bridges, complete and partial dentures, implants, gum treatment, root canals, and extractions. Preventive care is also provided. Treatment is provided to both adults and children. Patients are selected via a lottery and there is often a waiting list.   [facility] [address] [place]  [phone]  [email or web address]   [facility] [address] [place], [place] [phone], Ext. [phone] Second and Fourth Thursday of each month, opens at 6:30 AM; Clinic ends at 9 AM.  Patients are seen on a first-come first-served basis, and a limited number are seen during each clinic.   [facility]  [address] Ether Griffins [place], [place] [phone]   Eligibility Requirements You must have lived in [place], [place], or [place] counties for at least the last three months.   You cannot be eligible for state or federal sponsored [place], including [employer], [place], or [employer].   You generally cannot be eligible for healthcare insurance through your employer.    How to apply: Eligibility screenings are held every Tuesday and Wednesday afternoon from 1:00 pm until 4:00 pm. You do not need an appointment for the interview!  [facility] [address], [place], [place] [phone]   [facility] Health Department  [phone]   [facility] Health Department  [phone]   [facility] Health Department  [phone]    Behavioral Health Resources in the Community: Intensive Outpatient Programs Organization         Address  Phone  Notes  [facility] [address] [place], [place] [phone]   [facility] [address], [place], [place] [phone]   [facility]: [facility] [address], [place], [place]  [phone]   [facility] [address]  [place], [place] [phone] or [phone]   Substance Abuse Resources Organization         Address  Phone  Notes  Alcohol [facility] Drug Services  [phone]   [facility]  [phone]   The [place]  [phone]   Floydene Flock  [phone]   Residential & Outpatient Substance Abuse Program  [phone]   Psychological Services Organization          Address  Phone  Notes  [facility] Behavioral Health  [phone]   [place] Services  [phone]   [facility]  [address], [place] [phone] or 6198792645    Mobile Crisis Teams Organization         Address  Phone  Notes  [facility], Mobile Crisis Care Unit  [phone]   [facility]  [address] [place], [place] [phone]   Doristine Locks [address] [place] [phone]    Self-Help/Support Groups Organization         Address  Phone             Notes  [facility] [place] - variety of support groups  336- I7437963 Call for more information  Narcotics Anonymous (NA), [facility] [address], [employer] [place]  2 meetings at this location   Statistician         Address  Phone  Notes  [facility] [address],    [place]  [phone]   [facility]  [address], [place] [address], [place], [place] [phone]   [facility] [address] [place], [place] [phone] Admissions: 8am-3pm M-F  [facility] [address]    [place], [place] [phone]   [facility] [address] [employer], [place], [place] [phone]   [facility] [address]  [place], [place] [phone]   [facility] - Intensive Outpatient [address], [place], [place] [phone]   [facility] ([facility] [address]  [place], [place] [phone] or [phone]   [facility] (RTS) [address] [place], [place] [phone] Accepts Medicaid  [facility] [place] [address],  [place] [phone] Substance Abuse/Addiction Treatment   [facility] Organization         Address  Phone  Notes  [facility]  [phone]   Angie Fava, PhD [address] [place], [place]   [phone] or [phone]   [facility] Behavioral   [address] [place], [place] [phone]   [facility] [address] [place], [place] [phone] Insurance/Medicaid/sponsorship through [facility] and Families [address]                                    [place], [place] [phone] Therapy/tele-psych/case  [facility] [address], [place] [phone]    Dr. Lolly Mustache  [phone]   [facility]  [employer] [facility] Dept. 1) [address] [place] 2) [address] [place] 3)  [address], [place] [phone]  [phone]   [facility] Child Abuse Hotline [phone])[phone] or [phone] (After Hours)

## 2014-08-13 NOTE — ED Notes (Signed)
The patient said both of her knees hurt and they are swollen.  She has taken tramadol that she was [facility] at [facility]  she said it is not helping.  She said she started two weeks ago and she tried to get through it but she cant.  She rates her pain 10/10.

## 2014-08-24 ENCOUNTER — Ambulatory Visit: Payer: PRIVATE HEALTH INSURANCE

## 2014-08-24 DIAGNOSIS — F259 Schizoaffective disorder, unspecified: Secondary | ICD-10-CM
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
## 2014-09-01 ENCOUNTER — Telehealth: Payer: Self-pay | Admitting: Infectious Disease

## 2014-09-01 DIAGNOSIS — I10 Essential (primary) hypertension: Secondary | ICD-10-CM

## 2014-09-01 MED ORDER — BENAZEPRIL-HYDROCHLOROTHIAZIDE 20-12.5 MG PO TABS: 1.0000 | ORAL_TABLET | Freq: Every day | ORAL | Status: DC

## 2014-09-01 MED ORDER — CITALOPRAM HYDROBROMIDE 20 MG PO TABS: 20.0000 mg | ORAL_TABLET | Freq: Every day | ORAL | Status: DC

## 2014-09-01 NOTE — Telephone Encounter (Signed)
Holly RedbirdKenny I re-wrote celexa rx for her but I dont see her seroquel dose?

## 2014-09-02 NOTE — Telephone Encounter (Signed)
forward

## 2014-09-03 ENCOUNTER — Ambulatory Visit: Payer: PRIVATE HEALTH INSURANCE | Admitting: Infectious Disease

## 2014-09-07 ENCOUNTER — Ambulatory Visit: Payer: PRIVATE HEALTH INSURANCE

## 2014-09-09 ENCOUNTER — Ambulatory Visit (INDEPENDENT_AMBULATORY_CARE_PROVIDER_SITE_OTHER): Payer: PRIVATE HEALTH INSURANCE | Admitting: Infectious Disease

## 2014-09-09 ENCOUNTER — Encounter: Payer: Self-pay | Admitting: Infectious Disease

## 2014-09-09 VITALS — BP 134/84 | HR 80 | Temp 97.9°F | Wt 252.0 lb

## 2014-09-09 DIAGNOSIS — B20 Human immunodeficiency virus [HIV] disease: Secondary | ICD-10-CM

## 2014-09-09 DIAGNOSIS — Z113 Encounter for screening for infections with a predominantly sexual mode of transmission: Secondary | ICD-10-CM

## 2014-09-09 DIAGNOSIS — I10 Essential (primary) hypertension: Secondary | ICD-10-CM

## 2014-09-09 DIAGNOSIS — M170 Bilateral primary osteoarthritis of knee: Secondary | ICD-10-CM

## 2014-09-09 DIAGNOSIS — F333 Major depressive disorder, recurrent, severe with psychotic symptoms: Secondary | ICD-10-CM

## 2014-09-09 DIAGNOSIS — F3162 Bipolar disorder, current episode mixed, moderate: Secondary | ICD-10-CM | POA: Diagnosis not present

## 2014-09-09 DIAGNOSIS — F3132 Bipolar disorder, current episode depressed, moderate: Secondary | ICD-10-CM | POA: Insufficient documentation

## 2014-09-09 HISTORY — DX: Bipolar disorder, current episode depressed, moderate: F31.32

## 2014-09-09 LAB — COMPLETE METABOLIC PANEL WITH GFR
ALBUMIN: 3.2 g/dL — AB (ref 3.5–5.2)
ALT: 12 U/L (ref 0–35)
AST: 16 U/L (ref 0–37)
Alkaline Phosphatase: 38 U/L — ABNORMAL LOW (ref 39–117)
BUN: 18 mg/dL (ref 6–23)
CHLORIDE: 107 meq/L (ref 96–112)
CO2: 24 mEq/L (ref 19–32)
Calcium: 8.7 mg/dL (ref 8.4–10.5)
Creat: 0.73 mg/dL (ref 0.50–1.10)
GFR, Est African American: >89 mL/min
GFR, Est Non African American: >89 mL/min
Glucose, Bld: 128 mg/dL — ABNORMAL HIGH (ref 70–99)
Potassium: 3.9 mEq/L (ref 3.5–5.3)
Sodium: 137 mEq/L (ref 135–145)
Total Bilirubin: 0.2 mg/dL (ref 0.2–1.2)
Total Protein: 8.3 g/dL (ref 6.0–8.3)

## 2014-09-09 LAB — CBC WITH DIFFERENTIAL/PLATELET
BASOS ABS: 0.1 10*3/uL (ref 0.0–0.1)
Basophils Relative: 1 % (ref 0–1)
Eosinophils Absolute: 0.2 10*3/uL (ref 0.0–0.7)
Eosinophils Relative: 4 % (ref 0–5)
HEMATOCRIT: 32.8 % — AB (ref 36.0–46.0)
Hemoglobin: 10.9 g/dL — ABNORMAL LOW (ref 12.0–15.0)
LYMPHS ABS: 2 10*3/uL (ref 0.7–4.0)
Lymphocytes Relative: 38 % (ref 12–46)
MCH: 29.8 pg (ref 26.0–34.0)
MCHC: 33.2 g/dL (ref 30.0–36.0)
MCV: 89.6 fL (ref 78.0–100.0)
MPV: 9.7 fL (ref 8.6–12.4)
Monocytes Absolute: 0.5 10*3/uL (ref 0.1–1.0)
Monocytes Relative: 9 % (ref 3–12)
NEUTROS ABS: 2.5 10*3/uL (ref 1.7–7.7)
Neutrophils Relative %: 48 % (ref 43–77)
PLATELETS: 335 10*3/uL (ref 150–400)
RBC: 3.66 MIL/uL — ABNORMAL LOW (ref 3.87–5.11)
RDW: 15.1 % (ref 11.5–15.5)
WBC: 5.2 10*3/uL (ref 4.0–10.5)

## 2014-09-09 LAB — RPR

## 2014-09-09 MED ORDER — LISINOPRIL 20 MG PO TABS: 20.0000 mg | ORAL_TABLET | Freq: Every day | ORAL | Status: DC

## 2014-09-09 MED ORDER — EMTRICITABINE-TENOFOVIR AF 200-25 MG PO TABS: 1.0000 | ORAL_TABLET | Freq: Every day | ORAL | Status: DC

## 2014-09-09 MED ORDER — QUETIAPINE FUMARATE 100 MG PO TABS
100.0000 mg | ORAL_TABLET | ORAL | Status: DC
Start: 2014-09-09 — End: 2014-09-09

## 2014-09-09 MED ORDER — QUETIAPINE FUMARATE 100 MG PO TABS: 100.0000 mg | ORAL_TABLET | ORAL | Status: DC

## 2014-09-09 MED ORDER — CITALOPRAM HYDROBROMIDE 20 MG PO TABS: 20.0000 mg | ORAL_TABLET | Freq: Every day | ORAL | Status: DC

## 2014-09-09 NOTE — Progress Notes (Signed)
Subjective:    Patient ID: Holly Hunt, female    DOB: 05/15/1965, 49 y.o.   MRN: [id number]  Knee Pain  There was no injury mechanism. The pain is present in the left knee and right knee. The pain is at a severity of 10/10. The pain is severe. The pain has been worsening since onset. Associated symptoms include muscle weakness. She reports no foreign bodies present. The symptoms are aggravated by movement and weight bearing. She has tried acetaminophen, NSAIDs and non-weight bearing for the symptoms. The treatment provided mild relief.    Holly Hunt is a 49 year old African American lady who was diagnosed with HIV in 2010 and started on on meds in  2011, on Prezista, norvir, and Truvada. She has not been consistently in care recently. She had been incarcerated and was in jail but now out of jail and working at [employer] part time.  She claims to have been highly adherent to her ARV regimen.  She is complaining of severe bilateral knee pain and has been evaluated by Dr. Eulah PontMurphy who  states has told her that she needs bilateral TKA but this cannot be paid by orange card  [facility] but [facility] RedbirdKenny and patient requested if I could at least temporarily provide rx for the pt.   She also asked for something stronger than NSAIDS for pain.   Review of Systems  Constitutional: Negative for fever, chills, diaphoresis, activity change, appetite change, fatigue and unexpected weight change.  HENT: Negative for congestion, rhinorrhea, sinus pressure, sneezing, sore throat and trouble swallowing.   Eyes: Negative for photophobia and visual disturbance.  Respiratory: Negative for cough, chest tightness, shortness of breath, wheezing and stridor.   Cardiovascular: Negative for chest pain, palpitations and leg swelling.  Gastrointestinal: Negative for nausea, vomiting, abdominal pain, diarrhea, constipation, blood in stool and abdominal distention.  Genitourinary: Negative  for dysuria, hematuria, flank pain and difficulty urinating.  Musculoskeletal: Positive for myalgias, joint swelling and arthralgias. Negative for back pain and gait problem.  Skin: Negative for color change, pallor, rash and wound.  Neurological: Negative for dizziness, tremors, weakness and light-headedness.  Hematological: Negative for adenopathy. Does not bruise/bleed easily.  Psychiatric/Behavioral: Positive for dysphoric mood. Negative for behavioral problems, confusion, sleep disturbance, decreased concentration and agitation.       Objective:   Physical Exam  Constitutional: She is oriented to person, place, and time. She appears well-developed and well-nourished. No distress.  HENT:  Head: Normocephalic and atraumatic.  Mouth/Throat: No oropharyngeal exudate.  Eyes: Conjunctivae and EOM are normal. No scleral icterus.  Neck: Normal range of motion. Neck supple.  Cardiovascular: Normal rate and regular rhythm.   Pulmonary/Chest: Effort normal. No respiratory distress. She has no wheezes.  Abdominal: She exhibits no distension.  Musculoskeletal: She exhibits no edema or tenderness.  Neurological: She is alert and oriented to person, place, and time. She exhibits normal muscle tone. Coordination normal.  Skin: Skin is warm and dry. No rash noted. She is not diaphoretic. No erythema. No pallor.  Psychiatric: She has a normal mood and affect. Her behavior is normal. Judgment and thought content normal.         Assessment & Plan:   HIV disease: continue PREZCOBIX and change to DESCOVY for safer bone and kidney profile, recheck labs today and renew ADAP  HTN: renewed BP meds   Knee pain due to severe OA: she apparently will  Need TKA. She is to  use NSAIDs for now. I am not going to add ultram even in short term given I am already adding 2 QT prolonging agents with a booster (COBI)  Depression, Bipolar disorder: I renewed her celexa and seroquel at  [facility]. Will need to  follow closely with Bernette Redbird. 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  I spent greater than 40 minutes with the patient including greater than 50% of time in face to face counsel of the patient re her HIV, HTN, OA, pain and Bipolar disorder and in coordination of their care.

## 2014-09-09 NOTE — Progress Notes (Signed)
I added in her seroquel

## 2014-09-10 LAB — MICROALBUMIN / CREATININE URINE RATIO
Creatinine, Urine: 348.4 mg/dL
MICROALB UR: 2.1 mg/dL — AB (ref <2.0)
MICROALB/CREAT RATIO: 6 mg/g (ref 0.0–30.0)

## 2014-09-11 LAB — HIV RNA, RTPCR W/R GT (RTI, PI,INT)
HIV 1 RNA Quant: 409 copies/mL — ABNORMAL HIGH (ref <20)
HIV-1 RNA QUANT, LOG: 2.61 {Log} — AB (ref <1.30)

## 2014-09-11 LAB — T-HELPER CELL (CD4) - (RCID CLINIC ONLY)
CD4 % Helper T Cell: 26 % — ABNORMAL LOW (ref 33–55)
CD4 T CELL ABS: 540 /uL (ref 400–2700)

## 2014-09-11 LAB — URINE CYTOLOGY ANCILLARY ONLY
CHLAMYDIA, DNA PROBE: NEGATIVE
Neisseria Gonorrhea: NEGATIVE

## 2014-09-15 ENCOUNTER — Emergency Department (HOSPITAL_COMMUNITY)
Admission: EM | Admit: 2014-09-15 | Discharge: 2014-09-15 | Disposition: A | Discharge status: Home / Self Care | Payer: PRIVATE HEALTH INSURANCE | Attending: Emergency Medicine | Admitting: Emergency Medicine

## 2014-09-15 ENCOUNTER — Encounter (HOSPITAL_COMMUNITY): Payer: Self-pay

## 2014-09-15 DIAGNOSIS — Z21 Asymptomatic human immunodeficiency virus [HIV] infection status: Secondary | ICD-10-CM | POA: Diagnosis not present

## 2014-09-15 DIAGNOSIS — Z87891 Personal history of nicotine dependence: Secondary | ICD-10-CM | POA: Diagnosis not present

## 2014-09-15 DIAGNOSIS — F319 Bipolar disorder, unspecified: Secondary | ICD-10-CM | POA: Insufficient documentation

## 2014-09-15 DIAGNOSIS — M25562 Pain in left knee: Secondary | ICD-10-CM | POA: Diagnosis present

## 2014-09-15 DIAGNOSIS — Z79899 Other long term (current) drug therapy: Secondary | ICD-10-CM | POA: Insufficient documentation

## 2014-09-15 DIAGNOSIS — M25561 Pain in right knee: Secondary | ICD-10-CM | POA: Insufficient documentation

## 2014-09-15 DIAGNOSIS — M170 Bilateral primary osteoarthritis of knee: Secondary | ICD-10-CM | POA: Diagnosis not present

## 2014-09-15 DIAGNOSIS — I10 Essential (primary) hypertension: Secondary | ICD-10-CM | POA: Diagnosis not present

## 2014-09-15 MED ORDER — ACETAMINOPHEN-CODEINE #3 300-30 MG PO TABS: 1.0000 | ORAL_TABLET | Freq: Four times a day (QID) | ORAL | Status: AC | PRN

## 2014-09-15 MED ORDER — OXYCODONE-ACETAMINOPHEN 5-325 MG PO TABS
1.0000 | ORAL_TABLET | Freq: Once | ORAL | Status: AC
  Administered 2014-09-15: 1 via ORAL
  Filled 2014-09-15: qty 1

## 2014-09-15 NOTE — ED Notes (Signed)
Pt reports she can take percocet and has no allergic reaction to it.

## 2014-09-15 NOTE — ED Provider Notes (Signed)
CSN: [id number]     Arrival date & time 09/15/14  1930 History  This chart was scribed for Fayrene HelperBowie Brylei Pedley, PA-C, working with Vanetta MuldersScott Zackowski, MD by Octavia HeirArianna Nassar, ED Scribe. This patient was seen in room TR06C/TR06C and the patient's care was started at 8:29 PM.    Chief Complaint  Patient presents with  . Knee Pain      The history is provided by the patient. No language interpreter was used.   HPI Comments: Holly Hunt is a 49 y.o. female who has a hx of osteoarthritis presents to the Emergency Department complaining of constant, gradual worsening bilateral knee pain onset for the past 7 months. She states her pain flared up yesterday and has been severe. She reports that her right knee is a little bit worse than her left knee. Pt notes being seen by specialist Dr. Eulah PontMurphy and notes the only thing to alleviate the pain is surgery. Pt states she has been taking OTC pain medication to alleviate the pain with no relief. She notes she is unable to walk due to pain and states her knee locks up on her. Pt works at [employer]. She denies fever and rash.  Past Medical History  Diagnosis Date  . Hypertension   . HIV disease 07/07/2014  . Osteoarthritis, knee   . Major depression, recurrent 07/07/2014  . Bipolar disorder with moderate depression 09/09/2014   Past Surgical History  Procedure Laterality Date  . Appendectomy    . Cesarean section     Family History  Problem Relation Age of Onset  . Hypertension Mother   . COPD Father    History  Substance Use Topics  . Smoking status: Former Smoker -- 0.50 packs/day for 30 years    Types: Cigarettes    Quit date: 02/27/2014  . Smokeless tobacco: Never Used  . Alcohol Use: No   OB History    Gravida Para Term Preterm AB TAB SAB Ectopic Multiple Living   5 3   1  1         Review of Systems  Constitutional: Negative for fever.  Musculoskeletal: Positive for arthralgias.  Skin: Negative for rash.      Allergies  Codeine  Home  Medications   Prior to Admission medications   Medication Sig Start Date End Date Taking? Authorizing Provider  benazepril-hydrochlorthiazide (LOTENSIN HCT) 20-12.5 MG per tablet Take 1 tablet by mouth daily. 09/01/14   Randall Hissornelius N Van Dam, MD  citalopram (CELEXA) 20 MG tablet Take 1 tablet (20 mg total) by mouth daily. 09/09/14   Randall Hissornelius N Van Dam, MD  darunavir-cobicistat (PREZCOBIX) 800-150 MG per tablet Take 1 tablet by mouth daily with lunch. 08/07/14   Gardiner Barefootobert W Comer, MD  diclofenac sodium (VOLTAREN) 1 % GEL Apply 2 g topically 4 (four) times daily as needed. To your knees Patient not taking: Reported on 09/09/2014 08/13/14   Dahlia ClientHannah Muthersbaugh, PA-C  emtricitabine-tenofovir AF (DESCOVY) 200-25 MG per tablet Take 1 tablet by mouth daily. 09/09/14   Randall Hissornelius N Van Dam, MD  hydrOXYzine (VISTARIL) 100 MG capsule Take 100 mg by mouth at bedtime.    Historical Provider, MD  imiquimod (ALDARA) 5 % cream Apply topically 3 (three) times a week. Patient not taking: Reported on 09/09/2014 07/07/14   Randall Hissornelius N Van Dam, MD  lisinopril (PRINIVIL,ZESTRIL) 20 MG tablet Take 1 tablet (20 mg total) by mouth daily. 09/09/14   Randall Hissornelius N Van Dam, MD  naproxen (NAPROSYN) 500 MG tablet Take 1 tablet (500 mg  total) by mouth 2 (two) times daily with a meal. 08/13/14   Hannah Muthersbaugh, PA-C  QUEtiapine (SEROQUEL) 100 MG tablet Take 1 tablet (100 mg total) by mouth as directed. 09/09/14   Randall Hiss, MD   Triage vitals: BP 162/108 mmHg  Pulse 81  Temp(Src) 99.1 F (37.3 C) (Oral)  Resp 16  Ht  (1.626 m)  Wt 280 lb (127.007 kg)  BMI 48.04 kg/m2  SpO2 96%  LMP 08/02/2014 Physical Exam  Constitutional: She appears well-developed and well-nourished. No distress.  HENT:  Head: Normocephalic and atraumatic.  Eyes: Right eye exhibits no discharge. Left eye exhibits no discharge.  Pulmonary/Chest: Effort normal. No respiratory distress.  Musculoskeletal:  Left and right knee tenderness diffusely  with intact flexion and extention, mild joint effusion noted, negative anterior and posterior drawer test Pedal pulses intact bilaterally   Neurological: She is alert. Coordination normal.  Skin: No rash noted. She is not diaphoretic.  Psychiatric: She has a normal mood and affect. Her behavior is normal.  Nursing note and vitals reviewed.   ED Course  Procedures  DIAGNOSTIC STUDIES: Oxygen Saturation is 96% on RA, normal by my interpretation.  COORDINATION OF CARE: 8:32 PM Discussed treatment plan which includes tylenol 3 and follow up with specialist about her knees with pt at bedside and pt agreed to plan.  Labs Review Labs Reviewed - No data to display  Imaging Review No results found.   EKG Interpretation None      MDM   Final diagnoses:  Primary osteoarthritis of both knees    BP 162/108 mmHg  Pulse 81  Temp(Src) 99.1 F (37.3 C) (Oral)  Resp 16  Ht  (1.626 m)  Wt 280 lb (127.007 kg)  BMI 48.04 kg/m2  SpO2 96%  LMP 08/02/2014  I personally performed the services described in this documentation, which was scribed in my presence. The recorded information has been reviewed and is accurate.    Fayrene Helper, PA-C 09/15/14 2040  Vanetta Mulders, MD 09/16/14 [phone]

## 2014-09-15 NOTE — Discharge Instructions (Signed)

## 2014-09-15 NOTE — ED Notes (Signed)
Pt reports bilateral knee pain ongoing since December, this flare up onset yesterday. She says she can barely walk due to the knee pain. Has been seen here multiple times for same complaint and was told she has bone rubbing against bone in both knees and states, "I cant take it anymore."

## 2014-09-20 LAB — HIV-1 GENOTYPING (RTI,PI,IN INHBTR): HIV-1 GENOTYPE: DETECTED

## 2014-09-30 ENCOUNTER — Telehealth: Payer: Self-pay | Admitting: Licensed Clinical Social Worker

## 2014-09-30 NOTE — Telephone Encounter (Signed)
Can she follow it at home with home BP cuff and bring [place] a log, we may need to add HCTZ 

## 2014-09-30 NOTE — Telephone Encounter (Signed)
Patient walked in today to do ADAP renewal and wanted someone to check her blood pressure. She has been experiencing headaches and foot swelling. Blood pressure was 164/101 p-79. She is currently taking lotensin daily she reports. What else should she be doing? Please advise

## 2014-10-01 NOTE — Telephone Encounter (Signed)
She currently lives in a women's home at [facility]. Im not sure that is possible.

## 2014-10-01 NOTE — Telephone Encounter (Signed)
That is fine 

## 2014-10-01 NOTE — Telephone Encounter (Signed)
September 13 with you

## 2014-10-01 NOTE — Telephone Encounter (Signed)
Oh I see, well when is her next appt?

## 2014-10-07 ENCOUNTER — Other Ambulatory Visit: Payer: Self-pay | Admitting: *Deleted

## 2014-10-07 ENCOUNTER — Encounter: Payer: Self-pay | Admitting: *Deleted

## 2014-10-07 DIAGNOSIS — I10 Essential (primary) hypertension: Secondary | ICD-10-CM

## 2014-10-07 DIAGNOSIS — F333 Major depressive disorder, recurrent, severe with psychotic symptoms: Secondary | ICD-10-CM

## 2014-10-07 DIAGNOSIS — B20 Human immunodeficiency virus [HIV] disease: Secondary | ICD-10-CM

## 2014-10-07 MED ORDER — BENAZEPRIL-HYDROCHLOROTHIAZIDE 20-12.5 MG PO TABS: 1.0000 | ORAL_TABLET | Freq: Every day | ORAL | Status: DC

## 2014-10-07 MED ORDER — DARUNAVIR-COBICISTAT 800-150 MG PO TABS: 1.0000 | ORAL_TABLET | Freq: Every day | ORAL | Status: AC

## 2014-10-07 MED ORDER — CITALOPRAM HYDROBROMIDE 20 MG PO TABS: 20.0000 mg | ORAL_TABLET | Freq: Every day | ORAL | Status: AC

## 2014-10-07 MED ORDER — EMTRICITABINE-TENOFOVIR AF 200-25 MG PO TABS: 1.0000 | ORAL_TABLET | Freq: Every day | ORAL | Status: AC

## 2014-10-07 MED ORDER — LISINOPRIL 20 MG PO TABS: 20.0000 mg | ORAL_TABLET | Freq: Every day | ORAL | Status: AC

## 2014-10-07 MED ORDER — QUETIAPINE FUMARATE 100 MG PO TABS: 100.0000 mg | ORAL_TABLET | ORAL | Status: AC

## 2014-10-23 ENCOUNTER — Emergency Department (HOSPITAL_COMMUNITY)
Admission: EM | Admit: 2014-10-23 | Discharge: 2014-10-23 | Disposition: A | Discharge status: Home / Self Care | Payer: PRIVATE HEALTH INSURANCE | Attending: Emergency Medicine | Admitting: Emergency Medicine

## 2014-10-23 ENCOUNTER — Encounter (HOSPITAL_COMMUNITY): Payer: Self-pay | Admitting: Emergency Medicine

## 2014-10-23 DIAGNOSIS — I10 Essential (primary) hypertension: Secondary | ICD-10-CM | POA: Insufficient documentation

## 2014-10-23 DIAGNOSIS — R2241 Localized swelling, mass and lump, right lower limb: Secondary | ICD-10-CM | POA: Diagnosis not present

## 2014-10-23 DIAGNOSIS — Z79899 Other long term (current) drug therapy: Secondary | ICD-10-CM | POA: Insufficient documentation

## 2014-10-23 DIAGNOSIS — Z87891 Personal history of nicotine dependence: Secondary | ICD-10-CM | POA: Insufficient documentation

## 2014-10-23 DIAGNOSIS — F319 Bipolar disorder, unspecified: Secondary | ICD-10-CM | POA: Insufficient documentation

## 2014-10-23 DIAGNOSIS — Z21 Asymptomatic human immunodeficiency virus [HIV] infection status: Secondary | ICD-10-CM | POA: Diagnosis not present

## 2014-10-23 DIAGNOSIS — M722 Plantar fascial fibromatosis: Secondary | ICD-10-CM | POA: Diagnosis not present

## 2014-10-23 DIAGNOSIS — Z791 Long term (current) use of non-steroidal anti-inflammatories (NSAID): Secondary | ICD-10-CM | POA: Diagnosis not present

## 2014-10-23 DIAGNOSIS — M79671 Pain in right foot: Secondary | ICD-10-CM | POA: Insufficient documentation

## 2014-10-23 DIAGNOSIS — M179 Osteoarthritis of knee, unspecified: Secondary | ICD-10-CM | POA: Diagnosis not present

## 2014-10-23 MED ORDER — NAPROXEN 500 MG PO TABS: 500.0000 mg | ORAL_TABLET | Freq: Two times a day (BID) | ORAL | Status: AC

## 2014-10-23 MED ORDER — HYDROCODONE-ACETAMINOPHEN 5-325 MG PO TABS
1.0000 | ORAL_TABLET | Freq: Once | ORAL | Status: AC
  Administered 2014-10-23: 1 via ORAL
  Filled 2014-10-23: qty 1

## 2014-10-23 MED ORDER — HYDROCODONE-ACETAMINOPHEN 5-325 MG PO TABS: 1.0000 | ORAL_TABLET | Freq: Four times a day (QID) | ORAL | Status: AC | PRN

## 2014-10-23 NOTE — ED Provider Notes (Signed)
CSN: [id number]     Arrival date & time 10/23/14  0755 History   First MD Initiated Contact with Patient 10/23/14 0759     Chief Complaint  Patient presents with  . Foot Pain     (Consider location/radiation/quality/duration/timing/severity/associated sxs/prior Treatment) HPI Comments: Holly Hunt is a 49 y.o. female with a PMHx of HTN, HIV on Prezcobix and Descovy, knee arthritis, major depression, and bipolar disorder, who presents to the ED with complaints of right heel pain 2 days. She reports 8/10 constant right heel pain nearly insertion of the plantar fascia, throbbing, nonradiating, worse with standing or walking, and animal improved with Aleve. She endorses mild swelling to the area. She denies any erythema or warmth, fevers, chills, chest pain, shortness breath, abdominal pain, nausea, vomiting, diarrhea, dysuria, hematuria, numbness, tingling, weakness, recent tick bites, known injury, or prior injury to this area. Denies hx of gout. She reports she works all day standing up, and has to wear nonslip shoes which are not supportive on her arch. She previously went to Dr. Eulah Pont for her orthopedic care.  She sees Dr. Daiva Eves for her HIV. She saw him last on 09/09/14 and her CD4 count at that time was 540. She is complaint with all medications.   Patient is a 49 y.o. female presenting with lower extremity pain. The history is provided by the patient. No language interpreter was used.  Foot Pain This is a new problem. The current episode started in the past 7 days. The problem occurs constantly. The problem has been gradually worsening. Associated symptoms include arthralgias and joint swelling. Pertinent negatives include no abdominal pain, chest pain, chills, fever, myalgias, nausea, numbness, vomiting or weakness. The symptoms are aggravated by walking and standing. She has tried NSAIDs for the symptoms. The treatment provided mild relief.    Past Medical History  Diagnosis Date  .  Hypertension   . HIV disease 07/07/2014  . Osteoarthritis, knee   . Major depression, recurrent 07/07/2014  . Bipolar disorder with moderate depression 09/09/2014   Past Surgical History  Procedure Laterality Date  . Appendectomy    . Cesarean section     Family History  Problem Relation Age of Onset  . Hypertension Mother   . COPD Father    Social History  Substance Use Topics  . Smoking status: Former Smoker -- 0.50 packs/day for 30 years    Types: Cigarettes    Quit date: 02/27/2014  . Smokeless tobacco: Never Used  . Alcohol Use: No   OB History    Gravida Para Term Preterm AB TAB SAB Ectopic Multiple Living   Review of Systems  Constitutional: Negative for fever and chills.  Respiratory: Negative for shortness of breath.   Cardiovascular: Negative for chest pain.  Gastrointestinal: Negative for nausea, vomiting, abdominal pain and diarrhea.  Genitourinary: Negative for dysuria and hematuria.  Musculoskeletal: Positive for joint swelling and arthralgias. Negative for myalgias.  Skin: Negative for color change.  Allergic/Immunologic: Positive for immunocompromised state (HIV).  Neurological: Negative for weakness and numbness.  Psychiatric/Behavioral: Negative for confusion.   10 Systems reviewed and are negative for acute change except as noted in the HPI.    Allergies  Review of patient's allergies indicates no active allergies.  Home Medications   Prior to Admission medications   Medication Sig Start Date End Date Taking? Authorizing Provider  acetaminophen-codeine (TYLENOL #3) 300-30 MG per tablet Take 1-2  tablets by mouth every 6 (six) hours as needed for moderate pain. 09/15/14   Fayrene Helper, PA-C  benazepril-hydrochlorthiazide (LOTENSIN HCT) 20-12.5 MG per tablet Take 1 tablet by mouth daily. 10/07/14   Randall Hiss, MD  citalopram (CELEXA) 20 MG tablet Take 1 tablet (20 mg total) by mouth daily. 10/07/14   Randall Hiss, MD    darunavir-cobicistat (PREZCOBIX) 800-150 MG per tablet Take 1 tablet by mouth daily with lunch. 10/07/14   Randall Hiss, MD  diclofenac sodium (VOLTAREN) 1 % GEL Apply 2 g topically 4 (four) times daily as needed. To your knees Patient not taking: Reported on 09/09/2014 08/13/14   Dahlia Client Muthersbaugh, PA-C  emtricitabine-tenofovir AF (DESCOVY) 200-25 MG per tablet Take 1 tablet by mouth daily. 10/07/14   Randall Hiss, MD  hydrOXYzine (VISTARIL) 100 MG capsule Take 100 mg by mouth at bedtime.    Historical Provider, MD  imiquimod (ALDARA) 5 % cream Apply topically 3 (three) times a week. Patient not taking: Reported on 09/09/2014 07/07/14   Randall Hiss, MD  lisinopril (PRINIVIL,ZESTRIL) 20 MG tablet Take 1 tablet (20 mg total) by mouth daily. 10/07/14   Randall Hiss, MD  naproxen (NAPROSYN) 500 MG tablet Take 1 tablet (500 mg total) by mouth 2 (two) times daily with a meal. 08/13/14   Hannah Muthersbaugh, PA-C  QUEtiapine (SEROQUEL) 100 MG tablet Take 1 tablet (100 mg total) by mouth as directed. 10/07/14   Randall Hiss, MD   BP 138/84 mmHg  Pulse 94  Temp(Src) 98.1 F (36.7 C) (Oral)  Resp 17  Ht 5\' 4"  (1.626 m)  Wt 280 lb (127.007 kg)  BMI 48.04 kg/m2  SpO2 98%  LMP 08/28/2014 Physical Exam  Constitutional: She is oriented to person, place, and time. Vital signs are normal. She appears well-developed and well-nourished.  Non-toxic appearance. No distress.  Afebrile, nontoxic, NAD  HENT:  Head: Normocephalic and atraumatic.  Mouth/Throat: Mucous membranes are normal.  Eyes: Conjunctivae and EOM are normal. Right eye exhibits no discharge. Left eye exhibits no discharge.  Neck: Normal range of motion. Neck supple.  Cardiovascular: Normal rate and intact distal pulses.   Pulmonary/Chest: Effort normal. No respiratory distress.  Abdominal: Normal appearance. She exhibits no distension.  Musculoskeletal: Normal range of motion.       Right foot: There is  tenderness. There is normal range of motion, no swelling, normal capillary refill and no deformity.       Feet:  R ankle with FROM intact, no swelling or deformity, with mild TTP of plantar fascia and calcaneus at insertion site of plantar fascia but no TTP or swelling of fore foot or calf. No break in skin. No bruising or erythema. No warmth. Achilles intact. Good pedal pulse and cap refill of all toes. Wiggling toes without difficulty. Sensation grossly intact.   Neurological: She is alert and oriented to person, place, and time. She has normal strength. No sensory deficit.  Skin: Skin is warm, dry and intact. No rash noted.  Psychiatric: She has a normal mood and affect. Her behavior is normal.  Nursing note and vitals reviewed.   ED Course  Procedures (including critical care time) Labs Review Labs Reviewed - No data to display  Imaging Review No results found. I have personally reviewed and evaluated these images and lab results as part of my medical decision-making.   EKG Interpretation None      MDM  Final diagnoses:  Heel pain, right  Plantar fasciitis of right foot    49 y.o. female here with R heel pain, NVI with soft compartments, tenderness to plantar fascia and calcaneus at insertion site. Likely plantar fasciitis. Pt wears poorly supportive shoes and stands up all day at work. No injury, doubt need for xray. Doubt septic joint or gout. Will have her f/up with her orthopedist in 1wk. Discussed ice bottle stretches. Will give small supply of pain meds, and scheduled naprosyn. I explained the diagnosis and have given explicit precautions to return to the ER including for any other new or worsening symptoms. The patient understands and accepts the medical plan as it's been dictated and I have answered their questions. Discharge instructions concerning home care and prescriptions have been given. The patient is STABLE and is discharged to home in good condition.  BP 138/84  mmHg  Pulse 94  Temp(Src) 98.1 F (36.7 C) (Oral)  Resp 17  Ht 5\' 4"  (1.626 m)  Wt 280 lb (127.007 kg)  BMI 48.04 kg/m2  SpO2 98%  LMP 08/28/2014  Meds ordered this encounter  Medications  . HYDROcodone-acetaminophen (NORCO/VICODIN) 5-325 MG per tablet 1 tablet    Sig:   . naproxen (NAPROSYN) 500 MG tablet    Sig: Take 1 tablet (500 mg total) by mouth 2 (two) times daily with a meal. X 1 week    Dispense:  14 tablet    Refill:  0    Order Specific Question:  Supervising Provider    Answer:  MILLER, BRIAN [[id number]]  . HYDROcodone-acetaminophen (NORCO) 5-325 MG per tablet    Sig: Take 1 tablet by mouth every 6 (six) hours as needed for severe pain.    Dispense:  6 tablet    Refill:  0    Order Specific Question:  Supervising Provider    Answer:  Eber Hong [[id number]]       Concetta Guion Camprubi-Soms, PA-C 10/23/14 0820  Benjiman Core, MD 10/23/14 [phone]

## 2014-10-23 NOTE — Discharge Instructions (Signed)
Wear more supportive shoes. Ice and elevate foot/ankle throughout the day, using a frozen water bottle to stretch and ice the arch of your foot in the mornings. Use naprosyn scheduled as directed, and use norco for additional pain relief. Do not drive or operate machinery with pain medication use. Call your orthopedist doctor for follow up for recheck of ongoing ankle pain in 1-2 weeks. Return to the ER for changes or worsening symptoms.    Plantar Fasciitis Plantar fasciitis is a common condition that causes foot pain. It is soreness (inflammation) of the band of tough fibrous tissue on the bottom of the foot that runs from the heel bone (calcaneus) to the ball of the foot. The cause of this soreness may be from excessive standing, poor fitting shoes, running on hard surfaces, being overweight, having an abnormal walk, or overuse (this is common in runners) of the painful foot or feet. It is also common in aerobic exercise dancers and ballet dancers. SYMPTOMS  Most people with plantar fasciitis complain of:  Severe pain in the morning on the bottom of their foot especially when taking the first steps out of bed. This pain recedes after a few minutes of walking.  Severe pain is experienced also during walking following a long period of inactivity.  Pain is worse when walking barefoot or up stairs DIAGNOSIS   Your caregiver will diagnose this condition by examining and feeling your foot.  Special tests such as X-rays of your foot, are usually not needed. PREVENTION   Consult a sports medicine professional before beginning a new exercise program.  Walking programs offer a good workout. With walking there is a lower chance of overuse injuries common to runners. There is less impact and less jarring of the joints.  Begin all new exercise programs slowly. If problems or pain develop, decrease the amount of time or distance until you are at a comfortable level.  Wear good shoes and replace them  regularly.  Stretch your foot and the heel cords at the back of the ankle (Achilles tendon) both before and after exercise.  Run or exercise on even surfaces that are not hard. For example, asphalt is better than pavement.  Do not run barefoot on hard surfaces.  If using a treadmill, vary the incline.  Do not continue to workout if you have foot or joint problems. Seek professional help if they do not improve. HOME CARE INSTRUCTIONS   Avoid activities that cause you pain until you recover.  Use ice or cold packs on the problem or painful areas after working out.  Only take over-the-counter or prescription medicines for pain, discomfort, or fever as directed by your caregiver.  Soft shoe inserts or athletic shoes with air or gel sole cushions may be helpful.  If problems continue or become more severe, consult a sports medicine caregiver or your own health care provider. Cortisone is a potent anti-inflammatory medication that may be injected into the painful area. You can discuss this treatment with your caregiver. MAKE SURE YOU:   Understand these instructions.  Will watch your condition.  Will get help right away if you are not doing well or get worse. Document Released: 11/08/2000 Document Revised: 05/08/2011 Document Reviewed: 01/08/2008 [facility] Patient Information 2015 [place], [place]. This information is not intended to replace advice given to you by your health care provider. Make sure you discuss any questions you have with your health care provider.  Heel Spur A heel spur is a hook of bone that  can form on the calcaneus (the heel bone and the largest bone of the foot). Heel spurs are often associated with plantar fasciitis and usually come in people who have had the problem for an extended period of time. The cause of the relationship is unknown. The pain associated with them is thought to be caused by an inflammation (soreness and redness) of the plantar fascia rather than  the spur itself. The plantar fascia is a thick fibrous like tissue that runs from the calcaneus (heel bone) to the ball of the foot. This strong, tight tissue helps maintain the arch of your foot. It helps distribute the weight across your foot as you walk or run. Stresses placed on the plantar fascia can be tremendous. When it is inflamed normal activities become painful. Pain is worse in the morning after sleeping. After sleeping the plantar fascia is tight. The first movements stretch the fascia and this causes pain. As the tendon loosens, the pain usually gets better. It often returns with too much standing or walking.  About 70% of patients with plantar fasciitis have a heel spur. About half of people without foot pain also have heel spurs. DIAGNOSIS  The diagnosis of a heel spur is made by X-ray. The X-ray shows a hook of bone protruding from the bottom of the calcaneus at the point where the plantar fascia is attached to the heel bone.  TREATMENT  It is necessary to find out what is causing the stretching of the plantar fascia. If the cause is over-pronation (flat feet), orthotics and proper foot ware may help.  Stretching exercises, losing weight, wearing shoes that have a cushioned heel that absorbs shock, and elevating the heel with the use of a heel cradle, heel cup, or orthotics may all help. Heel cradles and heel cups provide extra comfort and cushion to the heel, and reduce the amount of shock to the sore area. AVOIDING THE PAIN OF PLANTAR FASCIITIS AND HEEL SPURS  Consult a sports medicine professional before beginning a new exercise program.  Walking programs offer a good workout. There is a lower chance of overuse injuries common to the runners. There is less impact and less jarring of the joints.  Begin all new exercise programs slowly. If problems or pains develop, decrease the amount of time or distance until you are at a comfortable level.  Wear good shoes and replace them  regularly.  Stretch your foot and the heel cords at the back of the ankle (Achilles tendons) both before and after exercise.  Run or exercise on even surfaces that are not hard. For example, asphalt is better than pavement.  Do not run barefoot on hard surfaces.  If using a treadmill, vary the incline.  Do not continue to workout if you have foot or joint problems. Seek professional help if they do not improve. HOME CARE INSTRUCTIONS   Avoid activities that cause you pain until you recover.  Use ice or cold packs to the problem or painful areas after working out.  Only take over-the-counter or prescription medicines for pain, discomfort, or fever as directed by your caregiver.  Soft shoe inserts or athletic shoes with air or gel sole cushions may be helpful.  If problems continue or become more severe, consult a sports medicine caregiver. Cortisone is a potent anti-inflammatory medication that may be injected into the painful area. You can discuss this treatment with your caregiver. MAKE SURE YOU:   Understand these instructions.  Will watch your condition.  Will get help right away if you are not doing well or get worse. Document Released: 03/22/2005 Document Revised: 05/08/2011 Document Reviewed: 04/16/2013 [facility] Patient Information 2015 [place], [place]. This information is not intended to replace advice given to you by your health care provider. Make sure you discuss any questions you have with your health care provider.  Cryotherapy Cryotherapy means treatment with cold. Ice or gel packs can be used to reduce both pain and swelling. Ice is the most helpful within the first 24 to 48 hours after an injury or flare-up from overusing a muscle or joint. Sprains, strains, spasms, burning pain, shooting pain, and aches can all be eased with ice. Ice can also be used when recovering from surgery. Ice is effective, has very few side effects, and is safe for most people to  use. PRECAUTIONS  Ice is not a safe treatment option for people with:  Raynaud phenomenon. This is a condition affecting small blood vessels in the extremities. Exposure to cold may cause your problems to return.  Cold hypersensitivity. There are many forms of cold hypersensitivity, including:  Cold urticaria. Red, itchy hives appear on the skin when the tissues begin to warm after being iced.  Cold erythema. This is a red, itchy rash caused by exposure to cold.  Cold hemoglobinuria. Red blood cells break down when the tissues begin to warm after being iced. The hemoglobin that carry oxygen are passed into the urine because they cannot combine with blood proteins fast enough.  Numbness or altered sensitivity in the area being iced. If you have any of the following conditions, do not use ice until you have discussed cryotherapy with your caregiver:  Heart conditions, such as arrhythmia, angina, or chronic heart disease.  High blood pressure.  Healing wounds or open skin in the area being iced.  Current infections.  Rheumatoid arthritis.  Poor circulation.  Diabetes. Ice slows the blood flow in the region it is applied. This is beneficial when trying to stop inflamed tissues from spreading irritating chemicals to surrounding tissues. However, if you expose your skin to cold temperatures for too long or without the proper protection, you can damage your skin or nerves. Watch for signs of skin damage due to cold. HOME CARE INSTRUCTIONS Follow these tips to use ice and cold packs safely.  Place a dry or damp towel between the ice and skin. A damp towel will cool the skin more quickly, so you may need to shorten the time that the ice is used.  For a more rapid response, add gentle compression to the ice.  Ice for no more than 10 to 20 minutes at a time. The bonier the area you are icing, the less time it will take to get the benefits of ice.  Check your skin after 5 minutes to make  sure there are no signs of a poor response to cold or skin damage.  Rest 20 minutes or more between uses.  Once your skin is numb, you can end your treatment. You can test numbness by very lightly touching your skin. The touch should be so light that you do not see the skin dimple from the pressure of your fingertip. When using ice, most people will feel these normal sensations in this order: cold, burning, aching, and numbness.  Do not use ice on someone who cannot communicate their responses to pain, such as small children or people with dementia. HOW TO MAKE AN ICE PACK Ice packs are the most common  way to use ice therapy. Other methods include ice massage, ice baths, and cryosprays. Muscle creams that cause a cold, tingly feeling do not offer the same benefits that ice offers and should not be used as a substitute unless recommended by your caregiver. To make an ice pack, do one of the following:  Place crushed ice or a bag of frozen vegetables in a sealable plastic bag. Squeeze out the excess air. Place this bag inside another plastic bag. Slide the bag into a pillowcase or place a damp towel between your skin and the bag.  Mix 3 parts water with 1 part rubbing alcohol. Freeze the mixture in a sealable plastic bag. When you remove the mixture from the freezer, it will be slushy. Squeeze out the excess air. Place this bag inside another plastic bag. Slide the bag into a pillowcase or place a damp towel between your skin and the bag. SEEK MEDICAL CARE IF:  You develop white spots on your skin. This may give the skin a blotchy (mottled) appearance.  Your skin turns blue or pale.  Your skin becomes waxy or hard.  Your swelling gets worse. MAKE SURE YOU:   Understand these instructions.  Will watch your condition.  Will get help right away if you are not doing well or get worse. Document Released: 10/10/2010 Document Revised: 06/30/2013 Document Reviewed: 10/10/2010 [facility] Patient  Information 2015 [place], [place]. This information is not intended to replace advice given to you by your health care provider. Make sure you discuss any questions you have with your health care provider.

## 2014-10-23 NOTE — ED Notes (Signed)
Patient states foot has been hurting a few days.  Patient states R heel pain.  Patient states has been taking aleve at home.   Patient denies other symptoms.

## 2014-10-26 ENCOUNTER — Encounter (HOSPITAL_COMMUNITY): Payer: Self-pay

## 2014-10-26 ENCOUNTER — Emergency Department (HOSPITAL_COMMUNITY)
Admission: EM | Admit: 2014-10-26 | Discharge: 2014-10-26 | Disposition: A | Discharge status: Home / Self Care | Payer: Self-pay | Attending: Emergency Medicine | Admitting: Emergency Medicine

## 2014-10-26 ENCOUNTER — Emergency Department (HOSPITAL_COMMUNITY): Payer: PRIVATE HEALTH INSURANCE

## 2014-10-26 DIAGNOSIS — F319 Bipolar disorder, unspecified: Secondary | ICD-10-CM | POA: Insufficient documentation

## 2014-10-26 DIAGNOSIS — R079 Chest pain, unspecified: Secondary | ICD-10-CM | POA: Insufficient documentation

## 2014-10-26 DIAGNOSIS — M25562 Pain in left knee: Secondary | ICD-10-CM | POA: Insufficient documentation

## 2014-10-26 DIAGNOSIS — I10 Essential (primary) hypertension: Secondary | ICD-10-CM | POA: Insufficient documentation

## 2014-10-26 DIAGNOSIS — Z720 Tobacco use: Secondary | ICD-10-CM | POA: Insufficient documentation

## 2014-10-26 DIAGNOSIS — G8929 Other chronic pain: Secondary | ICD-10-CM | POA: Insufficient documentation

## 2014-10-26 DIAGNOSIS — M179 Osteoarthritis of knee, unspecified: Secondary | ICD-10-CM | POA: Insufficient documentation

## 2014-10-26 DIAGNOSIS — M25561 Pain in right knee: Secondary | ICD-10-CM | POA: Insufficient documentation

## 2014-10-26 DIAGNOSIS — Z21 Asymptomatic human immunodeficiency virus [HIV] infection status: Secondary | ICD-10-CM | POA: Insufficient documentation

## 2014-10-26 DIAGNOSIS — Z79899 Other long term (current) drug therapy: Secondary | ICD-10-CM | POA: Insufficient documentation

## 2014-10-26 LAB — I-STAT CHEM 8, ED
BUN: 15 mg/dL (ref 6–20)
CALCIUM ION: 1.18 mmol/L (ref 1.12–1.23)
Chloride: 100 mmol/L — ABNORMAL LOW (ref 101–111)
Creatinine, Ser: 0.8 mg/dL (ref 0.44–1.00)
GLUCOSE: 85 mg/dL (ref 65–99)
HCT: 37 % (ref 36.0–46.0)
Hemoglobin: 12.6 g/dL (ref 12.0–15.0)
Potassium: 4.4 mmol/L (ref 3.5–5.1)
SODIUM: 136 mmol/L (ref 135–145)
TCO2: 27 mmol/L (ref 0–100)

## 2014-10-26 LAB — I-STAT TROPONIN, ED
TROPONIN I, POC: 0.01 ng/mL (ref 0.00–0.08)
Troponin i, poc: 0.01 ng/mL (ref 0.00–0.08)

## 2014-10-26 MED ORDER — ASPIRIN 81 MG PO CHEW
324.0000 mg | CHEWABLE_TABLET | Freq: Once | ORAL | Status: AC
  Administered 2014-10-26: 324 mg via ORAL
  Filled 2014-10-26: qty 4

## 2014-10-26 MED ORDER — FAMOTIDINE 20 MG PO TABS: 20.0000 mg | ORAL_TABLET | Freq: Two times a day (BID) | ORAL | Status: AC

## 2014-10-26 MED ORDER — TRAMADOL HCL 50 MG PO TABS: 50.0000 mg | ORAL_TABLET | Freq: Four times a day (QID) | ORAL | Status: AC | PRN

## 2014-10-26 MED ORDER — OXYCODONE-ACETAMINOPHEN 5-325 MG PO TABS
1.0000 | ORAL_TABLET | Freq: Once | ORAL | Status: AC
Start: 2014-10-26 — End: 2014-10-26
  Administered 2014-10-26: 1 via ORAL
  Filled 2014-10-26: qty 1

## 2014-10-26 MED ORDER — GI COCKTAIL ~~LOC~~
30.0000 mL | Freq: Once | ORAL | Status: AC
Start: 2014-10-26 — End: 2014-10-26
  Administered 2014-10-26: 30 mL via ORAL
  Filled 2014-10-26: qty 30

## 2014-10-26 NOTE — ED Notes (Signed)
Pt reports relief of pain for a short time following GI cocktail.

## 2014-10-26 NOTE — ED Notes (Signed)
Pt given a bus pass and a meal.  

## 2014-10-26 NOTE — ED Notes (Signed)
Pt reported she started having chest pain. PA alerted and at bedside

## 2014-10-26 NOTE — ED Notes (Signed)
Pt alert and oriented x4. Respirations even and unlabored, bilateral symmetrical rise and fall of chest. Skin warm and dry. In no acute distress. Denies needs.   

## 2014-10-26 NOTE — ED Notes (Signed)
Per EMS- Patient c/o right knee pain and slight swelling. Worse when ambulating.

## 2014-10-26 NOTE — ED Notes (Signed)
Bed: WTR5 Expected date:  Expected time:  Means of arrival:  Comments: 

## 2014-10-26 NOTE — ED Notes (Signed)
Pt to xray

## 2014-10-26 NOTE — ED Provider Notes (Signed)
CSN: [id number]     Arrival date & time 10/26/14  0802 History   First MD Initiated Contact with Patient 10/26/14 0815     No chief complaint on file.    (Consider location/radiation/quality/duration/timing/severity/associated sxs/prior Treatment) HPI Holly Hunt is a 49 y.o. female with history of HIV disease, hypertension, chronic bilateral knee pain, presents to emergency department complaining of worsening right knee pain. Patient states pain worsened yesterday. She has not taken any medications prior to coming in. She denies any injuries. She states her knee feels swollen and painful when she walks. She states she has seen orthopedist for this issue in the past and was told that she may need a knee replacement. Patient states she does not have money to have knee replacement. Patient currently works at [employer] which she considers to be her exercise. She states she is unable to do anything else due to pain.  Past Medical History  Diagnosis Date  . Hypertension   . HIV disease 07/07/2014  . Osteoarthritis, knee   . Major depression, recurrent 07/07/2014  . Bipolar disorder with moderate depression 09/09/2014   Past Surgical History  Procedure Laterality Date  . Appendectomy    . Cesarean section     Family History  Problem Relation Age of Onset  . Hypertension Mother   . COPD Father    Social History  Substance Use Topics  . Smoking status: Current Some Day Smoker -- 0.25 packs/day for 30 years    Types: Cigarettes    Last Attempt to Quit: 02/27/2014  . Smokeless tobacco: Never Used  . Alcohol Use: No   OB History    Gravida Para Term Preterm AB TAB SAB Ectopic Multiple Living   5 3   1  1         Review of Systems  Constitutional: Negative for fever and chills.  Musculoskeletal: Positive for joint swelling and arthralgias.  Neurological: Negative for weakness and numbness.  All other systems reviewed and are negative.     Allergies  Review of patient's allergies  indicates no known allergies.  Home Medications   Prior to Admission medications   Medication Sig Start Date End Date Taking? Authorizing Provider  benazepril-hydrochlorthiazide (LOTENSIN HCT) 20-12.5 MG per tablet Take 1 tablet by mouth daily. 10/07/14  Yes Randall Hiss, MD  citalopram (CELEXA) 20 MG tablet Take 1 tablet (20 mg total) by mouth daily. 10/07/14  Yes Randall Hiss, MD  darunavir-cobicistat (PREZCOBIX) 800-150 MG per tablet Take 1 tablet by mouth daily with lunch. Patient taking differently: Take 1 tablet by mouth daily with breakfast.  10/07/14  Yes Randall Hiss, MD  emtricitabine-tenofovir AF (DESCOVY) 200-25 MG per tablet Take 1 tablet by mouth daily. 10/07/14  Yes Randall Hiss, MD  lisinopril (PRINIVIL,ZESTRIL) 20 MG tablet Take 1 tablet (20 mg total) by mouth daily. 10/07/14  Yes Randall Hiss, MD  acetaminophen-codeine (TYLENOL #3) 300-30 MG per tablet Take 1-2 tablets by mouth every 6 (six) hours as needed for moderate pain. Patient not taking: Reported on 10/26/2014 09/15/14   Fayrene Helper, PA-C  diclofenac sodium (VOLTAREN) 1 % GEL Apply 2 g topically 4 (four) times daily as needed. To your knees Patient not taking: Reported on 09/09/2014 08/13/14   Dahlia Client Muthersbaugh, PA-C  HYDROcodone-acetaminophen (NORCO) 5-325 MG per tablet Take 1 tablet by mouth every 6 (six) hours as needed for severe pain. Patient not taking: Reported on 10/26/2014 10/23/14   Mercedes Camprubi-Soms,  PA-C  hydrOXYzine (VISTARIL) 100 MG capsule Take 100 mg by mouth at bedtime.    Historical Provider, MD  imiquimod (ALDARA) 5 % cream Apply topically 3 (three) times a week. Patient not taking: Reported on 09/09/2014 07/07/14   Randall Hiss, MD  naproxen (NAPROSYN) 500 MG tablet Take 1 tablet (500 mg total) by mouth 2 (two) times daily with a meal. Patient not taking: Reported on 10/26/2014 08/13/14   Dahlia Client Muthersbaugh, PA-C  naproxen (NAPROSYN) 500 MG tablet Take 1 tablet  (500 mg total) by mouth 2 (two) times daily with a meal. X 1 week Patient not taking: Reported on 10/26/2014 10/23/14   Mercedes Camprubi-Soms, PA-C  QUEtiapine (SEROQUEL) 100 MG tablet Take 1 tablet (100 mg total) by mouth as directed. Patient not taking: Reported on 10/26/2014 10/07/14   Randall Hiss, MD   BP 155/100 mmHg  Pulse 86  Temp(Src) 98.5 F (36.9 C) (Oral)  Resp 18  SpO2 98%  LMP 08/28/2014 Physical Exam  Constitutional: She is oriented to person, place, and time. She appears well-developed and well-nourished. No distress.  Eyes: Conjunctivae are normal.  Neck: Neck supple.  Cardiovascular: Normal rate, regular rhythm, normal heart sounds and intact distal pulses.   Pulmonary/Chest: Effort normal and breath sounds normal. No respiratory distress. She has no wheezes. She has no rales.  Musculoskeletal: She exhibits no edema.  Normal-appearing right knee with no obvious swelling. Pain with range of motion, limited range of motion due to pain. Negative anterior and posterior drawer sign. No pain and laxity with medial or lateral stress.   Neurological: She is alert and oriented to person, place, and time.  Skin: Skin is warm and dry.  Nursing note and vitals reviewed.   ED Course  Procedures (including critical care time) Labs Review Labs Reviewed  I-STAT CHEM 8, ED - Abnormal; Notable for the following:    Chloride 100 (*)    All other components within normal limits  I-STAT TROPOININ, ED  Rosezena Sensor, ED    Imaging Review Dg Knee Complete 4 Views Right  10/26/2014   CLINICAL DATA:  Chronic knee pain with acute worsening 10/25/2014. No known injury. Subsequent encounter.  EXAM: RIGHT KNEE - COMPLETE 4+ VIEW  COMPARISON:  Plain films of the right knee 12/21/2013.  FINDINGS: No acute bony or joint abnormality is identified. Tricompartmental osteoarthritis with osteophytosis appearing worst laterally is unchanged. Small joint effusion is noted. No focal bony  lesion.  IMPRESSION: No acute abnormality.  Advanced for age tricompartmental osteoarthritis.   Electronically Signed   By: Drusilla Kanner M.D.   On: 10/26/2014 09:44   I have personally reviewed and evaluated these images and lab results as part of my medical decision-making.   EKG Interpretation   Date/Time:  Monday October 26 2014 10:07:02 EDT Ventricular Rate:  74 PR Interval:  178 QRS Duration: 82 QT Interval:  399 QTC Calculation: 443 R Axis:   39 Text Interpretation:  Sinus rhythm Early repolarization Confirmed by KNOTT  MD, DANIEL ([id number]) on 10/26/2014 10:16:14 AM      MDM   Final diagnoses:  Right knee pain  Chest pain, unspecified chest pain type      10:15 AM I went to reassess patient and discuss treatment plan for her knee pain, but patient advised me that she started having chest pressure. Pressure is central. Does not radiate. It comes and goes with associated shortness of breath. Patient has no cardiac history. Her heart score is  3. Will get EKG and labs. Will try GI cocktail.  2:51 PM Pt's repeat troponin is negative. CP most likely due to acid reflux. Pt is low risk. Will have her follow up outpatient. Will dc home with tramadol for knee pain. Elevate. Knee excercises. Pt requesting cab voucher, work note for work. Will take out for today and tomorrow. Instructed to follow up with pcp.   Filed Vitals:   10/26/14 0804 10/26/14 1020 10/26/14 1030 10/26/14 1230  BP: 155/100 141/75 143/87 174/81  Pulse: 86 71 69 64  Temp: 98.5 F (36.9 C)     TempSrc: Oral     Resp: SpO2: 98% 97%  93%     Jaynie Crumble, PA-C 10/26/14 1619  Lyndal Pulley, MD 10/27/14 [phone]

## 2014-10-26 NOTE — Discharge Instructions (Signed)
Keep knee elevated. Ice several times a day. Start knee exercises. Tramadol for pain. pepcid for chest pain. Follow up with primary care doctor.    Arthritis, Nonspecific Arthritis is inflammation of a joint. This usually means pain, redness, warmth or swelling are present. One or more joints may be involved. There are a number of types of arthritis. Your caregiver may not be able to tell what type of arthritis you have right away. CAUSES  The most common cause of arthritis is the wear and tear on the joint (osteoarthritis). This causes damage to the cartilage, which can break down over time. The knees, hips, back and neck are most often affected by this type of arthritis. Other types of arthritis and common causes of joint pain include:  Sprains and other injuries near the joint. Sometimes minor sprains and injuries cause pain and swelling that develop hours later.  Rheumatoid arthritis. This affects hands, feet and knees. It usually affects both sides of your body at the same time. It is often associated with chronic ailments, fever, weight loss and general weakness.  Crystal arthritis. Gout and pseudo gout can cause occasional acute severe pain, redness and swelling in the foot, ankle, or knee.  Infectious arthritis. Bacteria can get into a joint through a break in overlying skin. This can cause infection of the joint. Bacteria and viruses can also spread through the blood and affect your joints.  Drug, infectious and allergy reactions. Sometimes joints can become mildly painful and slightly swollen with these types of illnesses. SYMPTOMS   Pain is the main symptom.  Your joint or joints can also be red, swollen and warm or hot to the touch.  You may have a fever with certain types of arthritis, or even feel overall ill.  The joint with arthritis will hurt with movement. Stiffness is present with some types of arthritis. DIAGNOSIS  Your caregiver will suspect arthritis based on your  description of your symptoms and on your exam. Testing may be needed to find the type of arthritis:  Blood and sometimes urine tests.  X-ray tests and sometimes CT or MRI scans.  Removal of fluid from the joint (arthrocentesis) is done to check for bacteria, crystals or other causes. Your caregiver (or a specialist) will numb the area over the joint with a local anesthetic, and use a needle to remove joint fluid for examination. This procedure is only minimally uncomfortable.  Even with these tests, your caregiver may not be able to tell what kind of arthritis you have. Consultation with a specialist (rheumatologist) may be helpful. TREATMENT  Your caregiver will discuss with you treatment specific to your type of arthritis. If the specific type cannot be determined, then the following general recommendations may apply. Treatment of severe joint pain includes:  Rest.  Elevation.  Anti-inflammatory medication (for example, ibuprofen) may be prescribed. Avoiding activities that cause increased pain.  Only take over-the-counter or prescription medicines for pain and discomfort as recommended by your caregiver.  Cold packs over an inflamed joint may be used for 10 to 15 minutes every hour. Hot packs sometimes feel better, but do not use overnight. Do not use hot packs if you are diabetic without your caregiver's permission.  A cortisone shot into arthritic joints may help reduce pain and swelling.  Any acute arthritis that gets worse over the next 1 to 2 days needs to be looked at to be sure there is no joint infection. Long-term arthritis treatment involves modifying activities and lifestyle  to reduce joint stress jarring. This can include weight loss. Also, exercise is needed to nourish the joint cartilage and remove waste. This helps keep the muscles around the joint strong. HOME CARE INSTRUCTIONS   Do not take aspirin to relieve pain if gout is suspected. This elevates uric acid  levels.  Only take over-the-counter or prescription medicines for pain, discomfort or fever as directed by your caregiver.  Rest the joint as much as possible.  If your joint is swollen, keep it elevated.  Use crutches if the painful joint is in your leg.  Drinking plenty of fluids may help for certain types of arthritis.  Follow your caregiver's dietary instructions.  Try low-impact exercise such as:  Swimming.  Water aerobics.  Biking.  Walking.  Morning stiffness is often relieved by a warm shower.  Put your joints through regular range-of-motion. SEEK MEDICAL CARE IF:   You do not feel better in 24 hours or are getting worse.  You have side effects to medications, or are not getting better with treatment. SEEK IMMEDIATE MEDICAL CARE IF:   You have a fever.  You develop severe joint pain, swelling or redness.  Many joints are involved and become painful and swollen.  There is severe back pain and/or leg weakness.  You have loss of bowel or bladder control. Document Released: 03/23/2004 Document Revised: 05/08/2011 Document Reviewed: 04/08/2008 [facility] Patient Information 2015 [place], [place]. This information is not intended to replace advice given to you by your health care provider. Make sure you discuss any questions you have with your health care provider. Knee Pain The knee is the complex joint between your thigh and your lower leg. It is made up of bones, tendons, ligaments, and cartilage. The bones that make up the knee are:  The femur in the thigh.  The tibia and fibula in the lower leg.  The patella or kneecap riding in the groove on the lower femur. CAUSES  Knee pain is a common complaint with many causes. A few of these causes are:  Injury, such as:  A ruptured ligament or tendon injury.  Torn cartilage.  Medical conditions, such as:  Gout  Arthritis  Infections  Overuse, over training, or overdoing a physical activity. Knee pain  can be minor or severe. Knee pain can accompany debilitating injury. Minor knee problems often respond well to self-care measures or get well on their own. More serious injuries may need medical intervention or even surgery. SYMPTOMS The knee is complex. Symptoms of knee problems can vary widely. Some of the problems are:  Pain with movement and weight bearing.  Swelling and tenderness.  Buckling of the knee.  Inability to straighten or extend your knee.  Your knee locks and you cannot straighten it.  Warmth and redness with pain and fever.  Deformity or dislocation of the kneecap. DIAGNOSIS  Determining what is wrong may be very straight forward such as when there is an injury. It can also be challenging because of the complexity of the knee. Tests to make a diagnosis may include:  Your caregiver taking a history and doing a physical exam.  Routine X-rays can be used to rule out other problems. X-rays will not reveal a cartilage tear. Some injuries of the knee can be diagnosed by:  Arthroscopy a surgical technique by which a small video camera is inserted through tiny incisions on the sides of the knee. This procedure is used to examine and repair internal knee joint problems. Tiny instruments can  be used during arthroscopy to repair the torn knee cartilage (meniscus).  Arthrography is a radiology technique. A contrast liquid is directly injected into the knee joint. Internal structures of the knee joint then become visible on X-ray film.  An MRI scan is a non X-ray radiology procedure in which magnetic fields and a computer produce two- or three-dimensional images of the inside of the knee. Cartilage tears are often visible using an MRI scanner. MRI scans have largely replaced arthrography in diagnosing cartilage tears of the knee.  Blood work.  Examination of the fluid that helps to lubricate the knee joint (synovial fluid). This is done by taking a sample out using a needle and a  syringe. TREATMENT The treatment of knee problems depends on the cause. Some of these treatments are:  Depending on the injury, proper casting, splinting, surgery, or physical therapy care will be needed.  Give yourself adequate recovery time. Do not overuse your joints. If you begin to get sore during workout routines, back off. Slow down or do fewer repetitions.  For repetitive activities such as cycling or running, maintain your strength and nutrition.  Alternate muscle groups. For example, if you are a weight lifter, work the upper body on one day and the lower body the next.  Either tight or weak muscles do not give the proper support for your knee. Tight or weak muscles do not absorb the stress placed on the knee joint. Keep the muscles surrounding the knee strong.  Take care of mechanical problems.  If you have flat feet, orthotics or special shoes may help. See your caregiver if you need help.  Arch supports, sometimes with wedges on the inner or outer aspect of the heel, can help. These can shift pressure away from the side of the knee most bothered by osteoarthritis.  A brace called an "unloader" brace also may be used to help ease the pressure on the most arthritic side of the knee.  If your caregiver has prescribed crutches, braces, wraps or ice, use as directed. The acronym for this is PRICE. This means protection, rest, ice, compression, and elevation.  Nonsteroidal anti-inflammatory drugs (NSAIDs), can help relieve pain. But if taken immediately after an injury, they may actually increase swelling. Take NSAIDs with food in your stomach. Stop them if you develop stomach problems. Do not take these if you have a history of ulcers, stomach pain, or bleeding from the bowel. Do not take without your caregiver's approval if you have problems with fluid retention, heart failure, or kidney problems.  For ongoing knee problems, physical therapy may be helpful.  Glucosamine and  chondroitin are over-the-counter dietary supplements. Both may help relieve the pain of osteoarthritis in the knee. These medicines are different from the usual anti-inflammatory drugs. Glucosamine may decrease the rate of cartilage destruction.  Injections of a corticosteroid drug into your knee joint may help reduce the symptoms of an arthritis flare-up. They may provide pain relief that lasts a few months. You may have to wait a few months between injections. The injections do have a small increased risk of infection, water retention, and elevated blood sugar levels.  Hyaluronic acid injected into damaged joints may ease pain and provide lubrication. These injections may work by reducing inflammation. A series of shots may give relief for as long as 6 months.  Topical painkillers. Applying certain ointments to your skin may help relieve the pain and stiffness of osteoarthritis. Ask your pharmacist for suggestions. Many over the-counter products are  approved for temporary relief of arthritis pain.  In some countries, doctors often prescribe topical NSAIDs for relief of chronic conditions such as arthritis and tendinitis. A review of treatment with NSAID creams found that they worked as well as oral medications but without the serious side effects. PREVENTION  Maintain a healthy weight. Extra pounds put more strain on your joints.  Get strong, stay limber. Weak muscles are a common cause of knee injuries. Stretching is important. Include flexibility exercises in your workouts.  Be smart about exercise. If you have osteoarthritis, chronic knee pain or recurring injuries, you may need to change the way you exercise. This does not mean you have to stop being active. If your knees ache after jogging or playing basketball, consider switching to swimming, water aerobics, or other low-impact activities, at least for a few days a week. Sometimes limiting high-impact activities will provide relief.  Make  sure your shoes fit well. Choose footwear that is right for your sport.  Protect your knees. Use the proper gear for knee-sensitive activities. Use kneepads when playing volleyball or laying carpet. Buckle your seat belt every time you drive. Most shattered kneecaps occur in car accidents.  Rest when you are tired. SEEK MEDICAL CARE IF:  You have knee pain that is continual and does not seem to be getting better.  SEEK IMMEDIATE MEDICAL CARE IF:  Your knee joint feels hot to the touch and you have a high fever. MAKE SURE YOU:   Understand these instructions.  Will watch your condition.  Will get help right away if you are not doing well or get worse. Document Released: 12/11/2006 Document Revised: 05/08/2011 Document Reviewed: 12/11/2006 [facility] Patient Information 2015 [place], [place]. This information is not intended to replace advice given to you by your health care provider. Make sure you discuss any questions you have with your health care provider. Knee Exercises EXERCISES RANGE OF MOTION (ROM) AND STRETCHING EXERCISES These exercises may help you when beginning to rehabilitate your injury. Your symptoms may resolve with or without further involvement from your physician, physical therapist, or athletic trainer. While completing these exercises, remember:   Restoring tissue flexibility helps normal motion to return to the joints. This allows healthier, less painful movement and activity.  An effective stretch should be held for at least 30 seconds.  A stretch should never be painful. You should only feel a gentle lengthening or release in the stretched tissue. STRETCH - Knee Extension, Prone  Lie on your stomach on a firm surface, such as a bed or countertop. Place your right / left knee and leg just beyond the edge of the surface. You may wish to place a towel under the far end of your right / left thigh for comfort.  Relax your leg muscles and allow gravity to straighten your  knee. Your clinician may advise you to add an ankle weight if more resistance is helpful for you.  You should feel a stretch in the back of your right / left knee. Hold this position for __________ seconds. Repeat __________ times. Complete this stretch __________ times per day. * Your physician, physical therapist, or athletic trainer may ask you to add ankle weight to enhance your stretch.  RANGE OF MOTION - Knee Flexion, Active  Lie on your back with both knees straight. (If this causes back discomfort, bend your opposite knee, placing your foot flat on the floor.)  Slowly slide your heel back toward your buttocks until you feel a gentle stretch  in the front of your knee or thigh.  Hold for __________ seconds. Slowly slide your heel back to the starting position. Repeat __________ times. Complete this exercise __________ times per day.  STRETCH - Quadriceps, Prone   Lie on your stomach on a firm surface, such as a bed or padded floor.  Bend your right / left knee and grasp your ankle. If you are unable to reach your ankle or pant leg, use a belt around your foot to lengthen your reach.  Gently pull your heel toward your buttocks. Your knee should not slide out to the side. You should feel a stretch in the front of your thigh and/or knee.  Hold this position for __________ seconds. Repeat __________ times. Complete this stretch __________ times per day.  STRETCH - Hamstrings, Supine   Lie on your back. Loop a belt or towel over the ball of your right / left foot.  Straighten your right / left knee and slowly pull on the belt to raise your leg. Do not allow the right / left knee to bend. Keep your opposite leg flat on the floor.  Raise the leg until you feel a gentle stretch behind your right / left knee or thigh. Hold this position for __________ seconds. Repeat __________ times. Complete this stretch __________ times per day.  STRENGTHENING EXERCISES These exercises may help you  when beginning to rehabilitate your injury. They may resolve your symptoms with or without further involvement from your physician, physical therapist, or athletic trainer. While completing these exercises, remember:   Muscles can gain both the endurance and the strength needed for everyday activities through controlled exercises.  Complete these exercises as instructed by your physician, physical therapist, or athletic trainer. Progress the resistance and repetitions only as guided.  You may experience muscle soreness or fatigue, but the pain or discomfort you are trying to eliminate should never worsen during these exercises. If this pain does worsen, stop and make certain you are following the directions exactly. If the pain is still present after adjustments, discontinue the exercise until you can discuss the trouble with your clinician. STRENGTH - Quadriceps, Isometrics  Lie on your back with your right / left leg extended and your opposite knee bent.  Gradually tense the muscles in the front of your right / left thigh. You should see either your knee cap slide up toward your hip or increased dimpling just above the knee. This motion will push the back of the knee down toward the floor/mat/bed on which you are lying.  Hold the muscle as tight as you can without increasing your pain for __________ seconds.  Relax the muscles slowly and completely in between each repetition. Repeat __________ times. Complete this exercise __________ times per day.  STRENGTH - Quadriceps, Short Arcs   Lie on your back. Place a __________ inch towel roll under your knee so that the knee slightly bends.  Raise only your lower leg by tightening the muscles in the front of your thigh. Do not allow your thigh to rise.  Hold this position for __________ seconds. Repeat __________ times. Complete this exercise __________ times per day.  OPTIONAL ANKLE WEIGHTS: Begin with ____________________, but DO NOT exceed  ____________________. Increase in 1 pound/0.5 kilogram increments.  STRENGTH - Quadriceps, Straight Leg Raises  Quality counts! Watch for signs that the quadriceps muscle is working to insure you are strengthening the correct muscles and not "cheating" by substituting with healthier muscles.  Lay on your back with  your right / left leg extended and your opposite knee bent.  Tense the muscles in the front of your right / left thigh. You should see either your knee cap slide up or increased dimpling just above the knee. Your thigh may even quiver.  Tighten these muscles even more and raise your leg 4 to 6 inches off the floor. Hold for __________ seconds.  Keeping these muscles tense, lower your leg.  Relax the muscles slowly and completely in between each repetition. Repeat __________ times. Complete this exercise __________ times per day.  STRENGTH - Hamstring, Curls  Lay on your stomach with your legs extended. (If you lay on a bed, your feet may hang over the edge.)  Tighten the muscles in the back of your thigh to bend your right / left knee up to 90 degrees. Keep your hips flat on the bed/floor.  Hold this position for __________ seconds.  Slowly lower your leg back to the starting position. Repeat __________ times. Complete this exercise __________ times per day.  OPTIONAL ANKLE WEIGHTS: Begin with ____________________, but DO NOT exceed ____________________. Increase in 1 pound/0.5 kilogram increments.  STRENGTH - Quadriceps, Squats  Stand in a door frame so that your feet and knees are in line with the frame.  Use your hands for balance, not support, on the frame.  Slowly lower your weight, bending at the hips and knees. Keep your lower legs upright so that they are parallel with the door frame. Squat only within the range that does not increase your knee pain. Never let your hips drop below your knees.  Slowly return upright, pushing with your legs, not pulling with your  hands. Repeat __________ times. Complete this exercise __________ times per day.  STRENGTH - Quadriceps, Wall Slides  Follow guidelines for form closely. Increased knee pain often results from poorly placed feet or knees.  Lean against a smooth wall or door and walk your feet out 18-24 inches. Place your feet hip-width apart.  Slowly slide down the wall or door until your knees bend __________ degrees.* Keep your knees over your heels, not your toes, and in line with your hips, not falling to either side.  Hold for __________ seconds. Stand up to rest for __________ seconds in between each repetition. Repeat __________ times. Complete this exercise __________ times per day. * Your physician, physical therapist, or athletic trainer will alter this angle based on your symptoms and progress. Document Released: 12/28/2004 Document Revised: 06/30/2013 Document Reviewed: 05/28/2008 [facility] Patient Information 2015 [place], [place]. This information is not intended to replace advice given to you by your health care provider. Make sure you discuss any questions you have with your health care provider.

## 2014-11-10 ENCOUNTER — Ambulatory Visit: Payer: PRIVATE HEALTH INSURANCE | Admitting: Infectious Disease

## 2014-11-12 ENCOUNTER — Encounter: Payer: Self-pay | Admitting: Infectious Disease

## 2014-11-12 ENCOUNTER — Ambulatory Visit (INDEPENDENT_AMBULATORY_CARE_PROVIDER_SITE_OTHER): Payer: PRIVATE HEALTH INSURANCE | Admitting: Infectious Disease

## 2014-11-12 VITALS — BP 147/95 | Temp 98.0°F | Wt 250.0 lb

## 2014-11-12 DIAGNOSIS — Z23 Encounter for immunization: Secondary | ICD-10-CM

## 2014-11-12 DIAGNOSIS — F3132 Bipolar disorder, current episode depressed, moderate: Secondary | ICD-10-CM

## 2014-11-12 DIAGNOSIS — I10 Essential (primary) hypertension: Secondary | ICD-10-CM | POA: Diagnosis not present

## 2014-11-12 DIAGNOSIS — M1711 Unilateral primary osteoarthritis, right knee: Secondary | ICD-10-CM | POA: Diagnosis not present

## 2014-11-12 DIAGNOSIS — F331 Major depressive disorder, recurrent, moderate: Secondary | ICD-10-CM

## 2014-11-12 DIAGNOSIS — M25561 Pain in right knee: Secondary | ICD-10-CM

## 2014-11-12 DIAGNOSIS — B20 Human immunodeficiency virus [HIV] disease: Secondary | ICD-10-CM | POA: Diagnosis not present

## 2014-11-12 NOTE — Progress Notes (Signed)
Subjective:    Patient ID: Holly Hunt, female    DOB: [birth year], 49 y.o.   MRN: [id number]  Knee Pain  The incident occurred more than 1 week ago. The incident occurred at home. There was no injury mechanism. The pain is present in the right knee. The pain is at a severity of 10/10. The pain is severe. The pain has been worsening since onset. Associated symptoms include muscle weakness. She reports no foreign bodies present. The symptoms are aggravated by movement and weight bearing. She has tried acetaminophen, NSAIDs, non-weight bearing and rest for the symptoms. The treatment provided mild relief.    Holly Hunt is a 49 year old African American lady who was diagnosed with HIV in 2010 and started on on meds in  2011, on Prezista, norvir, and Truvada. She has not been consistently in care recently. She had been incarcerated and was in jail but now out of jail and working at [employer] part time.  She claims to have been highly adherent to her ARV regimen but her labs have NOT reflected this  She cotninues to  complaining of severe bilateral knee pain R> L and has been evaluated by Dr. Eulah Hunt who  states has told her that she needs bilateral TKA but this cannot be paid by orange card  She has been on seroquel and celexa from [facility] but Holly Hunt and patient requested if I could at least temporarily provide rx for the pt at last visit and unsurprisingly the seroquel was not covered by [employer].  Past Medical History  Diagnosis Date  . Hypertension   . HIV disease 07/07/2014  . Osteoarthritis, knee   . Major depression, recurrent 07/07/2014  . Bipolar disorder with moderate depression 09/09/2014   Past Surgical History  Procedure Laterality Date  . Appendectomy    . Cesarean section     Family History  Problem Relation Age of Onset  . Hypertension Mother   . COPD Father    Social History  Substance Use Topics  . Smoking status: Current Some Day Smoker -- 0.25 packs/day for 30 years   Types: Cigarettes    Last Attempt to Quit: 02/27/2014  . Smokeless tobacco: Never Used  . Alcohol Use: No    Current outpatient prescriptions:  .  acetaminophen-codeine (TYLENOL #3) 300-30 MG per tablet, Take 1-2 tablets by mouth every 6 (six) hours as needed for moderate pain., Disp: 15 tablet, Rfl: 0 .  citalopram (CELEXA) 20 MG tablet, Take 1 tablet (20 mg total) by mouth daily., Disp: 30 tablet, Rfl: 5 .  darunavir-cobicistat (PREZCOBIX) 800-150 MG per tablet, Take 1 tablet by mouth daily with lunch. (Patient taking differently: Take 1 tablet by mouth daily with breakfast. ), Disp: 30 tablet, Rfl: 5 .  diclofenac sodium (VOLTAREN) 1 % GEL, Apply 2 g topically 4 (four) times daily as needed. To your knees, Disp: 100 g, Rfl: 0 .  emtricitabine-tenofovir AF (DESCOVY) 200-25 MG per tablet, Take 1 tablet by mouth daily., Disp: 30 tablet, Rfl: 5 .  famotidine (PEPCID) 20 MG tablet, Take 1 tablet (20 mg total) by mouth 2 (two) times daily., Disp: 30 tablet, Rfl: 0 .  HYDROcodone-acetaminophen (NORCO) 5-325 MG per tablet, Take 1 tablet by mouth every 6 (six) hours as needed for severe pain., Disp: 6 tablet, Rfl: 0 .  hydrOXYzine (VISTARIL) 100 MG capsule, Take 100 mg by mouth at bedtime., Disp: , Rfl:  .  imiquimod (ALDARA) 5 % cream, Apply topically 3 (three) times  a week., Disp: 12 each, Rfl: 6 .  lisinopril (PRINIVIL,ZESTRIL) 20 MG tablet, Take 1 tablet (20 mg total) by mouth daily., Disp: 30 tablet, Rfl: 5 .  naproxen (NAPROSYN) 500 MG tablet, Take 1 tablet (500 mg total) by mouth 2 (two) times daily with a meal., Disp: 60 tablet, Rfl: 0 .  naproxen (NAPROSYN) 500 MG tablet, Take 1 tablet (500 mg total) by mouth 2 (two) times daily with a meal. X 1 week, Disp: 14 tablet, Rfl: 0 .  QUEtiapine (SEROQUEL) 100 MG tablet, Take 1 tablet (100 mg total) by mouth as directed., Disp: 90 tablet, Rfl: 3 .  traMADol (ULTRAM) 50 MG tablet, Take 1 tablet (50 mg total) by mouth every 6 (six) hours as needed.,  Disp: 20 tablet, Rfl: 0  No Known Allergies   Review of Systems  Constitutional: Negative for fever, chills, diaphoresis, activity change, appetite change, fatigue and unexpected weight change.  HENT: Negative for congestion, rhinorrhea, sinus pressure, sneezing, sore throat and trouble swallowing.   Eyes: Negative for photophobia and visual disturbance.  Respiratory: Negative for cough, chest tightness, shortness of breath, wheezing and stridor.   Cardiovascular: Negative for chest pain, palpitations and leg swelling.  Gastrointestinal: Negative for nausea, vomiting, abdominal pain, diarrhea, constipation, blood in stool and abdominal distention.  Genitourinary: Negative for dysuria, hematuria, flank pain and difficulty urinating.  Musculoskeletal: Positive for myalgias, joint swelling and arthralgias. Negative for back pain and gait problem.  Skin: Negative for color change, pallor, rash and wound.  Neurological: Negative for dizziness, tremors, weakness and light-headedness.  Hematological: Negative for adenopathy. Does not bruise/bleed easily.  Psychiatric/Behavioral: Positive for dysphoric mood. Negative for behavioral problems, confusion, sleep disturbance, decreased concentration and agitation.       Objective:   Physical Exam  Constitutional: She is oriented to person, place, and time. She appears well-developed and well-nourished. No distress.  HENT:  Head: Normocephalic and atraumatic.  Mouth/Throat: No oropharyngeal exudate.  Eyes: Conjunctivae and EOM are normal. No scleral icterus.  Neck: Normal range of motion. Neck supple.  Cardiovascular: Normal rate and regular rhythm.   Pulmonary/Chest: Effort normal. No respiratory distress. She has no wheezes.  Abdominal: She exhibits no distension.  Musculoskeletal: She exhibits no edema or tenderness.  Neurological: She is alert and oriented to person, place, and time. She exhibits normal muscle tone. Gait abnormal. Coordination  normal.  Skin: Skin is warm and dry. No rash noted. She is not diaphoretic. No erythema. No pallor.  Psychiatric: She has a normal mood and affect. Her behavior is normal. Judgment and thought content normal.         Assessment & Plan:   HIV disease: Imperfect control. Recheck labs and continue PREZCOBIX and  DESCOVY for safer bone and kidney profile  HTN: not clear what she is on an ACEI and ACEI/thiazide combo pill   Knee pain due to severe OA: she apparently will  Need TKA: Will see if case management can help and if she can be potentially [facility] at [facility] or [facility]  Depression, Bipolar disorder: I renewed her celexa and she will need to follow closely with Holly Hunt.   Anal condyloma: rxaldara 3x a week but will likely need surgical excision not addressed with pt this visi  I spent greater than 25 minutes with the patient including greater than 50% of time in face to face counsel of the patient re her HIV, HTN, OA, pain and Bipolar disorder and in coordination of their care.  Paulette Blanch  Dam, MD

## 2014-11-16 LAB — HIV-1 RNA ULTRAQUANT REFLEX TO GENTYP+

## 2014-12-10 ENCOUNTER — Ambulatory Visit: Payer: PRIVATE HEALTH INSURANCE

## 2015-01-14 DIAGNOSIS — Z139 Encounter for screening, unspecified: Secondary | ICD-10-CM

## 2015-02-08 NOTE — Congregational Nurse Program (Signed)
Congregational Nurse Program Note  Date of Encounter: 01/14/2015  Past Medical History: Past Medical History  Diagnosis Date  . Hypertension   . HIV disease 07/07/2014  . Osteoarthritis, knee   . Major depression, recurrent 07/07/2014  . Bipolar disorder with moderate depression 09/09/2014    Encounter Details:     CNP Questionnaire - 01/14/15 1457    Patient Demographics   Is this a new or existing patient? Existing   Patient is considered a/an Not Applicable   Patient Assistance   Patient's financial/insurance status [employer], [employer] Connects;Low Income   Patient referred to apply for the following financial assistance Not Applicable   Food insecurities addressed Provided food supplies   Transportation assistance No   Assistance securing medications No   Educational health offerings Hypertension   Encounter Details   Primary purpose of visit Chronic Illness/Condition Visit;Education/Health Concerns   Was an Emergency Department visit averted? Not Applicable   Does patient have a medical provider? Yes   Patient referred to Establish PCP   Was a mental health screening completed? (GAINS tool) No   Does patient have dental issues? No   Since previous encounter, have you referred patient for abnormal blood pressure that resulted in a new diagnosis or medication change? No   Since previous encounter, have you referred patient for abnormal blood glucose that resulted in a new diagnosis or medication change? No   For Abstraction Use Only   Does patient have insurance? No     clinic visit for B/P check  Instructed client to contact HCP today regarding B/P.  Instructed to RTC next week for a re-check of B/P

## 2015-02-11 ENCOUNTER — Ambulatory Visit: Payer: PRIVATE HEALTH INSURANCE | Admitting: Infectious Disease

## 2015-04-08 ENCOUNTER — Encounter: Payer: Self-pay | Admitting: Infectious Disease

## 2016-10-09 ENCOUNTER — Encounter: Payer: Self-pay | Admitting: Pediatric Intensive Care

## 2016-11-02 ENCOUNTER — Other Ambulatory Visit: Payer: PRIVATE HEALTH INSURANCE

## 2016-11-02 ENCOUNTER — Other Ambulatory Visit: Payer: Self-pay | Admitting: *Deleted

## 2016-11-06 NOTE — Congregational Nurse Program (Signed)
Congregational Nurse Program Note  Date of Encounter: 10/09/2016  Past Medical History: Past Medical History:  Diagnosis Date  . Bipolar disorder with moderate depression 09/09/2014  . HIV disease 07/07/2014  . Hypertension   . Major depression, recurrent 07/07/2014  . Osteoarthritis, knee     Encounter Details:     CNP Questionnaire - 10/09/16 1547      Patient Demographics   Is this a new or existing patient? New   Patient is considered a/an Not Applicable   Race African-American/Black     Patient Assistance   Location of Patient Assistance Not Applicable   Patient's financial/insurance status Low Income;Self-Pay (Uninsured)   Uninsured Patient ([employer] officer, trade unionCard/Care Connects) Yes   Interventions Not Applicable   Patient referred to apply for the following financial assistance Not Applicable   Food insecurities addressed Not Applicable   Transportation assistance No   Assistance securing medications No   Educational health offerings Behavioral health;Navigating the healthcare system     Encounter Details   Primary purpose of visit Chronic Illness/Condition Visit;Navigating the Healthcare System   Was an Emergency Department visit averted? Not Applicable   Does patient have a medical provider? Yes   Patient referred to Area Agency;Doctor referral for a non-emergent behavioral health crisis   Was a mental health screening completed? (GAINS tool) No   Does patient have dental issues? No   Does patient have vision issues? No   Does your patient have an abnormal blood pressure today? No   Since previous encounter, have you referred patient for abnormal blood pressure that resulted in a new diagnosis or medication change? No   Does your patient have an abnormal blood glucose today? No   Since previous encounter, have you referred patient for abnormal blood glucose that resulted in a new diagnosis or medication change? No   Was there a life-saving intervention made? No      Stating wants to go back on behavioral health medications.  Referred to [facility] for follow up

## 2016-11-09 NOTE — Congregational Nurse Program (Signed)
Congregational Nurse Program Note  Date of Encounter: 10/09/2016  Past Medical History: Past Medical History:  Diagnosis Date  . Bipolar disorder with moderate depression 09/09/2014  . HIV disease 07/07/2014  . Hypertension   . Major depression, recurrent 07/07/2014  . Osteoarthritis, knee     Encounter Details: Client states she needs bus passes for [facility] appointment. CN advised client that she will need to F/U in CN clinic at completion of appointment.

## 2016-11-13 ENCOUNTER — Ambulatory Visit: Payer: PRIVATE HEALTH INSURANCE | Admitting: Infectious Diseases

## 2016-11-16 ENCOUNTER — Ambulatory Visit (INDEPENDENT_AMBULATORY_CARE_PROVIDER_SITE_OTHER): Payer: PRIVATE HEALTH INSURANCE | Admitting: Licensed Clinical Social Worker

## 2016-11-16 ENCOUNTER — Encounter: Payer: Self-pay | Admitting: Infectious Diseases

## 2016-11-16 ENCOUNTER — Ambulatory Visit (INDEPENDENT_AMBULATORY_CARE_PROVIDER_SITE_OTHER): Payer: Self-pay | Admitting: Infectious Diseases

## 2016-11-16 VITALS — BP 163/83 | HR 83 | Temp 98.5°F | Wt 263.0 lb

## 2016-11-16 DIAGNOSIS — M1711 Unilateral primary osteoarthritis, right knee: Secondary | ICD-10-CM

## 2016-11-16 DIAGNOSIS — N912 Amenorrhea, unspecified: Secondary | ICD-10-CM

## 2016-11-16 DIAGNOSIS — I10 Essential (primary) hypertension: Secondary | ICD-10-CM

## 2016-11-16 DIAGNOSIS — B20 Human immunodeficiency virus [HIV] disease: Secondary | ICD-10-CM

## 2016-11-16 DIAGNOSIS — F3132 Bipolar disorder, current episode depressed, moderate: Secondary | ICD-10-CM

## 2016-11-16 DIAGNOSIS — Z23 Encounter for immunization: Secondary | ICD-10-CM

## 2016-11-16 DIAGNOSIS — Z1239 Encounter for other screening for malignant neoplasm of breast: Secondary | ICD-10-CM

## 2016-11-16 DIAGNOSIS — Z0000 Encounter for general adult medical examination without abnormal findings: Secondary | ICD-10-CM

## 2016-11-16 DIAGNOSIS — Z1231 Encounter for screening mammogram for malignant neoplasm of breast: Secondary | ICD-10-CM

## 2016-11-16 DIAGNOSIS — F339 Major depressive disorder, recurrent, unspecified: Secondary | ICD-10-CM

## 2016-11-16 DIAGNOSIS — F310 Bipolar disorder, current episode hypomanic: Secondary | ICD-10-CM

## 2016-11-16 MED ORDER — DARUN-COBIC-EMTRICIT-TENOFAF 800-150-200-10 MG PO TABS: 1.0000 | ORAL_TABLET | Freq: Every day | ORAL | 5 refills | Status: AC

## 2016-11-16 NOTE — Patient Instructions (Addendum)
Nice to meet you today.   Will check some blood work today for you and call you with decision about blood pressure medication choice  Sent in prescription for you for a new medication called Symtuza - you will take this medication once every day with food. We are checking to see if your ADAP has been approved.   For your knee pain - try taking Tylenol first. Can do 1 - 2 extra strength tablets every 6 hour --> do not take more than 8 tablets in 24 hour period.  Would limit use of Ibuprofen/Aleve/Advil products since they are tough on your stomach and kidneys.   Will have you come back in 1 month to check in with you and do your pap smear.    Hypertension Hypertension, commonly called high blood pressure, is when the force of blood pumping through the arteries is too strong. The arteries are the blood vessels that carry blood from the heart throughout the body. Hypertension forces the heart to work harder to pump blood and may cause arteries to become narrow or stiff. Having untreated or uncontrolled hypertension can cause heart attacks, strokes, kidney disease, and other problems. A blood pressure reading consists of a higher number over a lower number. Ideally, your blood pressure should be below 120/80. The first ("top") number is called the systolic pressure. It is a measure of the pressure in your arteries as your heart beats. The second ("bottom") number is called the diastolic pressure. It is a measure of the pressure in your arteries as the heart relaxes. What are the causes? The cause of this condition is not known. What increases the risk? Some risk factors for high blood pressure are under your control. Others are not. Factors you can change  Smoking.  Having type 2 diabetes mellitus, high cholesterol, or both.  Not getting enough exercise or physical activity.  Being overweight.  Having too much fat, sugar, calories, or salt (sodium) in your diet.  Drinking too much  alcohol. Factors that are difficult or impossible to change  Having chronic kidney disease.  Having a family history of high blood pressure.  Age. Risk increases with age.  Race. You may be at higher risk if you are African-American.  Gender. Men are at higher risk than women before age 59. After age 1, women are at higher risk than men.  Having obstructive sleep apnea.  Stress. What are the signs or symptoms? Extremely high blood pressure (hypertensive crisis) may cause:  Headache.  Anxiety.  Shortness of breath.  Nosebleed.  Nausea and vomiting.  Severe chest pain.  Jerky movements you cannot control (seizures).  How is this diagnosed? This condition is diagnosed by measuring your blood pressure while you are seated, with your arm resting on a surface. The cuff of the blood pressure monitor will be placed directly against the skin of your upper arm at the level of your heart. It should be measured at least twice using the same arm. Certain conditions can cause a difference in blood pressure between your right and left arms. Certain factors can cause blood pressure readings to be lower or higher than normal (elevated) for a short period of time:  When your blood pressure is higher when you are in a health care provider's office than when you are at home, this is called white coat hypertension. Most people with this condition do not need medicines.  When your blood pressure is higher at home than when you are in a  health care provider's office, this is called masked hypertension. Most people with this condition may need medicines to control blood pressure.  If you have a high blood pressure reading during one visit or you have normal blood pressure with other risk factors:  You may be asked to return on a different day to have your blood pressure checked again.  You may be asked to monitor your blood pressure at home for 1 week or longer.  If you are diagnosed with  hypertension, you may have other blood or imaging tests to help your health care provider understand your overall risk for other conditions. How is this treated? This condition is treated by making healthy lifestyle changes, such as eating healthy foods, exercising more, and reducing your alcohol intake. Your health care provider may prescribe medicine if lifestyle changes are not enough to get your blood pressure under control, and if:  Your systolic blood pressure is above 130.  Your diastolic blood pressure is above 80.  Your personal target blood pressure may vary depending on your medical conditions, your age, and other factors. Follow these instructions at home: Eating and drinking  Eat a diet that is high in fiber and potassium, and low in sodium, added sugar, and fat. An example eating plan is called the DASH (Dietary Approaches to Stop Hypertension) diet. To eat this way: ? Eat plenty of fresh fruits and vegetables. Try to fill half of your plate at each meal with fruits and vegetables. ? Eat whole grains, such as whole wheat pasta, brown rice, or whole grain bread. Fill about one quarter of your plate with whole grains. ? Eat or drink low-fat dairy products, such as skim milk or low-fat yogurt. ? Avoid fatty cuts of meat, processed or cured meats, and poultry with skin. Fill about one quarter of your plate with lean proteins, such as fish, chicken without skin, beans, eggs, and tofu. ? Avoid premade and processed foods. These tend to be higher in sodium, added sugar, and fat.  Reduce your daily sodium intake. Most people with hypertension should eat less than 1,500 mg of sodium a day.  Limit alcohol intake to no more than 1 drink a day for nonpregnant women and 2 drinks a day for men. One drink equals 12 oz of beer, 5 oz of wine, or 1 oz of hard liquor. Lifestyle  Work with your health care provider to maintain a healthy body weight or to lose weight. Ask what an ideal weight is  for you.  Get at least 30 minutes of exercise that causes your heart to beat faster (aerobic exercise) most days of the week. Activities may include walking, swimming, or biking.  Include exercise to strengthen your muscles (resistance exercise), such as pilates or lifting weights, as part of your weekly exercise routine. Try to do these types of exercises for 30 minutes at least 3 days a week.  Do not use any products that contain nicotine or tobacco, such as cigarettes and e-cigarettes. If you need help quitting, ask your health care provider.  Monitor your blood pressure at home as told by your health care provider.  Keep all follow-up visits as told by your health care provider. This is important. Medicines  Take over-the-counter and prescription medicines only as told by your health care provider. Follow directions carefully. Blood pressure medicines must be taken as prescribed.  Do not skip doses of blood pressure medicine. Doing this puts you at risk for problems and can make the  medicine less effective.  Ask your health care provider about side effects or reactions to medicines that you should watch for. Contact a health care provider if:  You think you are having a reaction to a medicine you are taking.  You have headaches that keep coming back (recurring).  You feel dizzy.  You have swelling in your ankles.  You have trouble with your vision. Get help right away if:  You develop a severe headache or confusion.  You have unusual weakness or numbness.  You feel faint.  You have severe pain in your chest or abdomen.  You vomit repeatedly.  You have trouble breathing. Summary  Hypertension is when the force of blood pumping through your arteries is too strong. If this condition is not controlled, it may put you at risk for serious complications.  Your personal target blood pressure may vary depending on your medical conditions, your age, and other factors. For most  people, a normal blood pressure is less than 120/80.  Hypertension is treated with lifestyle changes, medicines, or a combination of both. Lifestyle changes include weight loss, eating a healthy, low-sodium diet, exercising more, and limiting alcohol. This information is not intended to replace advice given to you by your health care provider. Make sure you discuss any questions you have with your health care provider. Document Released: 02/13/2005 Document Revised: 01/12/2016 Document Reviewed: 01/12/2016 Elsevier Interactive Patient Education  2018 [employer].    DASH Eating Plan DASH stands for "Dietary Approaches to Stop Hypertension." The DASH eating plan is a healthy eating plan that has been shown to reduce high blood pressure (hypertension). It may also reduce your risk for type 2 diabetes, heart disease, and stroke. The DASH eating plan may also help with weight loss. What are tips for following this plan? General guidelines  Avoid eating more than 2,300 mg (milligrams) of salt (sodium) a day. If you have hypertension, you may need to reduce your sodium intake to 1,500 mg a day.  Limit alcohol intake to no more than 1 drink a day for nonpregnant women and 2 drinks a day for men. One drink equals 12 oz of beer, 5 oz of wine, or 1 oz of hard liquor.  Work with your health care provider to maintain a healthy body weight or to lose weight. Ask what an ideal weight is for you.  Get at least 30 minutes of exercise that causes your heart to beat faster (aerobic exercise) most days of the week. Activities may include walking, swimming, or biking.  Work with your health care provider or diet and nutrition specialist (dietitian) to adjust your eating plan to your individual calorie needs. Reading food labels  Check food labels for the amount of sodium per serving. Choose foods with less than 5 percent of the Daily Value of sodium. Generally, foods with less than 300 mg of sodium per  serving fit into this eating plan.  To find whole grains, look for the word "whole" as the first word in the ingredient list. Shopping  Buy products labeled as "low-sodium" or "no salt added."  Buy fresh foods. Avoid canned foods and premade or frozen meals. Cooking  Avoid adding salt when cooking. Use salt-free seasonings or herbs instead of table salt or sea salt. Check with your health care provider or pharmacist before using salt substitutes.  Do not fry foods. Cook foods using healthy methods such as baking, boiling, grilling, and broiling instead.  Cook with heart-healthy oils, such as olive,  canola, soybean, or sunflower oil. Meal planning   Eat a balanced diet that includes: ? 5 or more servings of fruits and vegetables each day. At each meal, try to fill half of your plate with fruits and vegetables. ? Up to 6-8 servings of whole grains each day. ? Less than 6 oz of lean meat, poultry, or fish each day. A 3-oz serving of meat is about the same size as a deck of cards. One egg equals 1 oz. ? 2 servings of low-fat dairy each day. ? A serving of nuts, seeds, or beans 5 times each week. ? Heart-healthy fats. Healthy fats called Omega-3 fatty acids are found in foods such as flaxseeds and coldwater fish, like sardines, salmon, and mackerel.  Limit how much you eat of the following: ? Canned or prepackaged foods. ? Food that is high in trans fat, such as fried foods. ? Food that is high in saturated fat, such as fatty meat. ? Sweets, desserts, sugary drinks, and other foods with added sugar. ? Full-fat dairy products.  Do not salt foods before eating.  Try to eat at least 2 vegetarian meals each week.  Eat more home-cooked food and less restaurant, buffet, and fast food.  When eating at a restaurant, ask that your food be prepared with less salt or no salt, if possible. What foods are recommended? The items listed may not be a complete list. Talk with your dietitian about  what dietary choices are best for you. Grains Whole-grain or whole-wheat bread. Whole-grain or whole-wheat pasta. Brown rice. Orpah Cobb. Bulgur. Whole-grain and low-sodium cereals. Pita bread. Low-fat, low-sodium crackers. Whole-wheat flour tortillas. Vegetables Fresh or frozen vegetables (raw, steamed, roasted, or grilled). Low-sodium or reduced-sodium tomato and vegetable juice. Low-sodium or reduced-sodium tomato sauce and tomato paste. Low-sodium or reduced-sodium canned vegetables. Fruits All fresh, dried, or frozen fruit. Canned fruit in natural juice (without added sugar). Meat and other protein foods Skinless chicken or [place]. Ground chicken or [place]. Pork with fat trimmed off. Fish and seafood. Egg whites. Dried beans, peas, or lentils. Unsalted nuts, nut butters, and seeds. Unsalted canned beans. Lean cuts of beef with fat trimmed off. Low-sodium, lean deli meat. Dairy Low-fat (1%) or fat-free (skim) milk. Fat-free, low-fat, or reduced-fat cheeses. Nonfat, low-sodium ricotta or cottage cheese. Low-fat or nonfat yogurt. Low-fat, low-sodium cheese. Fats and oils Soft margarine without trans fats. Vegetable oil. Low-fat, reduced-fat, or light mayonnaise and salad dressings (reduced-sodium). Canola, safflower, olive, soybean, and sunflower oils. Avocado. Seasoning and other foods Herbs. Spices. Seasoning mixes without salt. Unsalted popcorn and pretzels. Fat-free sweets. What foods are not recommended? The items listed may not be a complete list. Talk with your dietitian about what dietary choices are best for you. Grains Baked goods made with fat, such as croissants, muffins, or some breads. Dry pasta or rice meal packs. Vegetables Creamed or fried vegetables. Vegetables in a cheese sauce. Regular canned vegetables (not low-sodium or reduced-sodium). Regular canned tomato sauce and paste (not low-sodium or reduced-sodium). Regular tomato and vegetable juice (not low-sodium or  reduced-sodium). Rosita Fire. Olives. Fruits Canned fruit in a light or heavy syrup. Fried fruit. Fruit in cream or butter sauce. Meat and other protein foods Fatty cuts of meat. Ribs. Fried meat. Tomasa Blase. Sausage. Bologna and other processed lunch meats. Salami. Fatback. Hotdogs. Bratwurst. Salted nuts and seeds. Canned beans with added salt. Canned or smoked fish. Whole eggs or egg yolks. Chicken or [place] with skin. Dairy Whole or 2% milk, cream, and half-and-half.  Whole or full-fat cream cheese. Whole-fat or sweetened yogurt. Full-fat cheese. Nondairy creamers. Whipped toppings. Processed cheese and cheese spreads. Fats and oils Butter. Stick margarine. Lard. Shortening. Ghee. Bacon fat. Tropical oils, such as coconut, palm kernel, or palm oil. Seasoning and other foods Salted popcorn and pretzels. Onion salt, garlic salt, seasoned salt, table salt, and sea salt. Worcestershire sauce. Tartar sauce. Barbecue sauce. Teriyaki sauce. Soy sauce, including reduced-sodium. Steak sauce. Canned and packaged gravies. Fish sauce. Oyster sauce. Cocktail sauce. Horseradish that you find on the shelf. Ketchup. Mustard. Meat flavorings and tenderizers. Bouillon cubes. Hot sauce and Tabasco sauce. Premade or packaged marinades. Premade or packaged taco seasonings. Relishes. Regular salad dressings. Where to find more information:  National Heart, Lung, and Blood Institute: PopSteam.is  American Heart Association: www.heart.org Summary  The DASH eating plan is a healthy eating plan that has been shown to reduce high blood pressure (hypertension). It may also reduce your risk for type 2 diabetes, heart disease, and stroke.  With the DASH eating plan, you should limit salt (sodium) intake to 2,300 mg a day. If you have hypertension, you may need to reduce your sodium intake to 1,500 mg a day.  When on the DASH eating plan, aim to eat more fresh fruits and vegetables, whole grains, lean proteins, low-fat  dairy, and heart-healthy fats.  Work with your health care provider or diet and nutrition specialist (dietitian) to adjust your eating plan to your individual calorie needs. This information is not intended to replace advice given to you by your health care provider. Make sure you discuss any questions you have with your health care provider. Document Released: 02/02/2011 Document Revised: 02/07/2016 Document Reviewed: 02/07/2016 Elsevier Interactive Patient Education  2017 [employer].

## 2016-11-16 NOTE — Progress Notes (Signed)
Thanks for taking care of her.

## 2016-11-16 NOTE — Progress Notes (Signed)
Brief Narrative: Holly Hunt   PCP - Daiva Eves, Lisette Grinder, MD    Patient Active Problem List   Diagnosis Date Noted  . Amenorrhea 11/16/2016  . Health care maintenance 11/16/2016  . Bipolar disorder with moderate depression (HCC) 09/09/2014  . HIV disease (HCC) 07/07/2014  . Major depression, recurrent (HCC) 07/07/2014  . Osteoarthritis, knee   . Hepatitis B core antibody positive 06/19/2014  . Right knee pain 06/19/2014  . Anal warts 06/19/2014  . Essential hypertension 06/19/2014    Patient's Medications  New Prescriptions   DARUNAVIR-COBICISCTAT-EMTRICITABINE-TENOFOVIR ALAFENAMIDE (SYMTUZA) 800-150-200-10 MG TABS    Take 1 tablet by mouth daily with breakfast.  Previous Medications   ACETAMINOPHEN-CODEINE (TYLENOL #3) 300-30 MG PER TABLET    Take 1-2 tablets by mouth every 6 (six) hours as needed for moderate pain.   CITALOPRAM (CELEXA) 20 MG TABLET    Take 1 tablet (20 mg total) by mouth daily.   DARUNAVIR-COBICISTAT (PREZCOBIX) 800-150 MG PER TABLET    Take 1 tablet by mouth daily with lunch.   DICLOFENAC SODIUM (VOLTAREN) 1 % GEL    Apply 2 g topically 4 (four) times daily as needed. To your knees   EMTRICITABINE-TENOFOVIR AF (DESCOVY) 200-25 MG PER TABLET    Take 1 tablet by mouth daily.   FAMOTIDINE (PEPCID) 20 MG TABLET    Take 1 tablet (20 mg total) by mouth 2 (two) times daily.   HYDROCODONE-ACETAMINOPHEN (NORCO) 5-325 MG PER TABLET    Take 1 tablet by mouth every 6 (six) hours as needed for severe pain.   HYDROXYZINE (VISTARIL) 100 MG CAPSULE    Take 100 mg by mouth at bedtime.   IMIQUIMOD (ALDARA) 5 % CREAM    Apply topically 3 (three) times a week.   LISINOPRIL (PRINIVIL,ZESTRIL) 20 MG TABLET    Take 1 tablet (20 mg total) by mouth daily.   NAPROXEN (NAPROSYN) 500 MG TABLET    Take 1 tablet (500 mg total) by mouth 2 (two) times daily with a meal.   NAPROXEN (NAPROSYN) 500 MG TABLET    Take 1 tablet (500 mg total) by mouth 2 (two) times daily with a meal. X 1  week   QUETIAPINE (SEROQUEL) 100 MG TABLET    Take 1 tablet (100 mg total) by mouth as directed.   TRAMADOL (ULTRAM) 50 MG TABLET    Take 1 tablet (50 mg total) by mouth every 6 (six) hours as needed.  Modified Medications   No medications on file  Discontinued Medications   No medications on file    Subjective: Holly Hunt presents to clinic today to re-establish care for her HIV infection. She was originally diagnosed in 2010 and delayed starting medications until 2011. Previous ART includes Prezista/Norovir, Truvada.   HPI:  HIV =  Currently not on medications for HIV infection. Last she was on Prezcobix / Descovy in 2015. Tells me the reason she was off medications was that "it got complicated." Ready to get back in control of her infection and overall health now. Working with Baird Lyons with [facility]. Tells me she had labs drawn recently in [facility] and was told her "labs look really good" although she does not have any details ov this.   Hypertension = previously managed on Lisinopril/HCTZ and it worked well for her. Has not been on meds for this in a few years. No vision changes but does endorse some associated headaches.   Fatigue = She also snores a  lot and is tired throughout the day. 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 On her days off she finds she needs to recover and sleeps most of the time.   Bipolar/Depression = off meds about 6 months now. Currently reports she is "doing OK" from this stand point. Denies current SI/HI. Previous suicide attempts and managed on several different medications (seroquel, citalopram, etc).    Health Maintenance = Holly Hunt reports daily 1/2 ppd cigarette smoking, no alcohol and no illicit drugs. 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 Cannot recall previous pap smear. Has not had a period in 14 months and s/p tubal ligation - currently sexually active and uses condoms. Has 3 children in their 30s. Associated symptoms of hot flashes  and night sweats. Uncertain about vaccine history. Mammogram previously but cannot recall when. Asking for referral for knee pain in right knee.     Review of Systems: Review of Systems  Constitutional: Positive for malaise/fatigue. Negative for chills, fever and weight loss.  HENT: Negative for sore throat.        No dental problems  Respiratory: Positive for wheezing. Negative for cough and sputum production.   Cardiovascular: Positive for leg swelling (at the end of the day). Negative for chest pain.  Gastrointestinal: Negative for abdominal pain, diarrhea and vomiting.  Genitourinary: Negative for dysuria and flank pain.  Musculoskeletal: Negative for joint pain, myalgias and neck pain.  Skin: Negative for rash.  Neurological: Positive for headaches. Negative for dizziness and tingling.  Psychiatric/Behavioral: Positive for depression. Negative for substance abuse. The patient is not nervous/anxious and does not have insomnia.     Past Medical History:  Diagnosis Date  . Bipolar disorder with moderate depression (HCC) 09/09/2014  . HIV disease (HCC) 07/07/2014  . Hypertension   . Major depression, recurrent (HCC) 07/07/2014  . Osteoarthritis, knee     Social History  Substance Use Topics  . Smoking status: Current Some Day Smoker    Packs/day: 0.50    Years: 30.00    Types: Cigarettes    Last attempt to quit: 02/27/2014  . Smokeless tobacco: Never Used  . Alcohol use No    Family History  Problem Relation Age of Onset  . Hypertension Mother   . COPD Father     No Known Allergies  Objective:  Vitals:   11/16/16 0929  BP: (!) 163/83  Pulse: 83  Temp: 98.5 F (36.9 C)  TempSrc: Oral  Weight: 263 lb (119.3 kg)   Body mass index is 45.14 kg/m.  Physical Exam  Constitutional: She is oriented to person, place, and time and well-developed, well-nourished, and in no distress.  Overweight female seated comfortably in chair.   HENT:  Mouth/Throat: No oral lesions.  No dental abscesses.  Eyes: No scleral icterus.  Cardiovascular: Normal rate, regular rhythm and normal heart sounds.   Pulmonary/Chest: Effort normal and breath sounds normal.  Abdominal: Soft. She exhibits no distension. There is no tenderness.  Musculoskeletal: Normal range of motion. She exhibits no edema or tenderness.  Lymphadenopathy:    She has no cervical adenopathy.  Neurological: She is alert and oriented to person, place, and time.  Skin: Skin is warm and dry. No rash noted.  Psychiatric: Mood, affect and judgment normal.    Lab Results HIV 1 RNA Quant (copies/mL)  Date Value  11/12/2014 <20  09/09/2014 409 (H)  06/16/2014 3,413 (H)   CD4 T Cell Abs (/uL)  Date Value  09/09/2014  540  06/16/2014 520   Lab Results  Component Value Date   HAV REACTIVE (A) 06/16/2014   Lab Results  Component Value Date   QUANTGOLD NEGATIVE 06/16/2014   No results found for: RPR    Problem List Items Addressed This Visit      Cardiovascular and Mediastinum   Essential hypertension    Elevated above target today. Will recheck kidney function as she tells me during an ER visit in [place] area she was once told her kidneys were affected. Likely OK to add back Lisinopril/HCTZ combo for her; will call with results. Otherwise may need to try amlodipine/HCTZ combo. Will also assess HgbA1C, Lipids, urinalysis today. Provided counseling and information regarding sodium reduction, tobacco cessation and increasing exercise to help combat this.       Relevant Orders   COMPLETE METABOLIC PANEL WITH GFR   Lipid panel   Hemoglobin A1c   Urinalysis     Musculoskeletal and Integument   Osteoarthritis, knee    Asked her to try tylenol and ice for pain control vs her Advil (which she is taking a lot of) for kidney protection and decrease likelihood of gastric ulcer formation. Provided information for [employer]. Was told previously she needs replacement. Can try to arrange to be seen at [facility] as well.         Other   HIV disease (HCC) - Primary (Chronic)    Uncertain if she has ADAP/RW approved; Have tried to reach out to Solomon (who she tells me has helped with application) with [facility] but off today. If not will need to arrange for her. We discussed starting her on Symtuza today which is the same combination she was on before stopping ART. Would like to continue her on PI based regimen as she is nearly a decade into treatment with spotty adherence and lapses in care. Will assess VL with genotype today as well as CD4 count. No signs of OI present on exam and will defer proph until labs result.       Relevant Medications   Darunavir-Cobicisctat-Emtricitabine-Tenofovir Alafenamide (SYMTUZA) 800-150-200-10 MG TABS   Other Relevant Orders   HIV-1 RNA ultraquant reflex to gentyp+   T-helper cell (CD4)- ([facility] clinic only)   Urine cytology ancillary only   RPR   COMPLETE METABOLIC PANEL WITH GFR   CBC with Differential/Platelet   Lipid panel   Hemoglobin A1c   hCG, quantitative, pregnancy   Urinalysis   Major depression, recurrent (HCC)   Bipolar disorder with moderate depression (HCC)    Not currently managed on medications or in care. I have had her meet with Cordelia Pen today. Will help encourage follow up with [facility] for management as she has complicated treatment history and needs ongoing specialty care.       Amenorrhea    Likely she has experienced menopause. Will check b-hcg today and FSH. Reassured her. Will arrange for PAP smear and pelvic exam in 1 month when she returns.       Relevant Orders   [facility]   Health care maintenance    Flu shot today. Needs Menveo. Hep A and B immune based on previous serology. Pap to be obtained in 1 month and will set up for mammogram with scholarship assistance for recommended surveillance.       Relevant Orders   MM DIGITAL SCREENING BILATERAL    Other Visit Diagnoses    Need for immunization against influenza       Relevant  Orders   Flu Vaccine QUAD 36+ mos IM (Completed)   Screening for breast cancer       Relevant Orders   MM DIGITAL SCREENING BILATERAL     Rexene Alberts, MSN, NP-C [facility] Pager: [phone]  11/16/16 12:45 PM

## 2016-11-16 NOTE — Assessment & Plan Note (Signed)
Not currently managed on medications or in care. I have had her meet with Cordelia Pen today. Will help encourage follow up with [facility] for management as she has complicated treatment history and needs ongoing specialty care.

## 2016-11-16 NOTE — Assessment & Plan Note (Signed)
Likely she has experienced menopause. Will check b-hcg today and FSH. Reassured her. Will arrange for PAP smear and pelvic exam in 1 month when she returns.

## 2016-11-16 NOTE — Assessment & Plan Note (Signed)
Flu shot today. Needs Menveo. Hep A and B immune based on previous serology. Pap to be obtained in 1 month and will set up for mammogram with scholarship assistance for recommended surveillance.

## 2016-11-16 NOTE — Assessment & Plan Note (Signed)
Elevated above target today. Will recheck kidney function as she tells me during an ER visit in [place] area she was once told her kidneys were affected. Likely OK to add back Lisinopril/HCTZ combo for her; will call with results. Otherwise may need to try amlodipine/HCTZ combo. Will also assess HgbA1C, Lipids, urinalysis today. Provided counseling and information regarding sodium reduction, tobacco cessation and increasing exercise to help combat this.

## 2016-11-16 NOTE — Assessment & Plan Note (Addendum)
Uncertain if she has ADAP/RW approved; Have tried to reach out to [employer] (who she tells me has helped with application) with [facility] but off today. If not will need to arrange for her. We discussed starting her on Symtuza today which is the same combination she was on before stopping ART. Would like to continue her on PI based regimen as she is nearly a decade into treatment with spotty adherence and lapses in care. Will assess VL with genotype today as well as CD4 count. No signs of OI present on exam and will defer proph until labs result.

## 2016-11-16 NOTE — Assessment & Plan Note (Addendum)
Asked her to try tylenol and ice for pain control vs her Advil (which she is taking a lot of) for kidney protection and decrease likelihood of gastric ulcer formation. Provided information for [employer]. Was told previously she needs replacement. Can try to arrange to be seen at [facility] as well.

## 2016-11-17 LAB — T-HELPER CELL (CD4) - (RCID CLINIC ONLY)
CD4 T CELL HELPER: 24 % — AB (ref 33–55)
CD4 T Cell Abs: 610 /uL (ref 400–2700)

## 2016-11-17 LAB — COMPLETE METABOLIC PANEL WITH GFR
AG Ratio: 0.5 (calc) — ABNORMAL LOW (ref 1.0–2.5)
ALKALINE PHOSPHATASE (APISO): 37 U/L (ref 33–130)
ALT: 17 U/L (ref 6–29)
AST: 16 U/L (ref 10–35)
Albumin: 3.2 g/dL — ABNORMAL LOW (ref 3.6–5.1)
BUN: 14 mg/dL (ref 7–25)
CO2: 26 mmol/L (ref 20–32)
CREATININE: 0.79 mg/dL (ref 0.50–1.05)
Calcium: 8.8 mg/dL (ref 8.6–10.4)
Chloride: 104 mmol/L (ref 98–110)
GFR, Est African American: 101 mL/min/{1.73_m2} (ref >=60)
GFR, Est Non African American: 87 mL/min/{1.73_m2} (ref >=60)
GLOBULIN: 6.1 g/dL — AB (ref 1.9–3.7)
GLUCOSE: 125 mg/dL — AB (ref 65–99)
Potassium: 4.2 mmol/L (ref 3.5–5.3)
SODIUM: 134 mmol/L — AB (ref 135–146)
Total Bilirubin: 0.3 mg/dL (ref 0.2–1.2)
Total Protein: 9.3 g/dL — ABNORMAL HIGH (ref 6.1–8.1)

## 2016-11-17 LAB — CBC WITH DIFFERENTIAL/PLATELET
BASOS ABS: 59 {cells}/uL (ref 0–200)
Basophils Relative: 1.1 %
EOS ABS: 243 {cells}/uL (ref 15–500)
Eosinophils Relative: 4.5 %
HEMATOCRIT: 35.4 % (ref 35.0–45.0)
Hemoglobin: 11.9 g/dL (ref 11.7–15.5)
LYMPHS ABS: 2489 {cells}/uL (ref 850–3900)
MCH: 29.9 pg (ref 27.0–33.0)
MCHC: 33.6 g/dL (ref 32.0–36.0)
MCV: 88.9 fL (ref 80.0–100.0)
MPV: 10.4 fL (ref 7.5–12.5)
Monocytes Relative: 7.3 %
NEUTROS PCT: 41 %
Neutro Abs: 2214 cells/uL (ref 1500–7800)
Platelets: 268 10*3/uL (ref 140–400)
RBC: 3.98 10*6/uL (ref 3.80–5.10)
RDW: 13.1 % (ref 11.0–15.0)
Total Lymphocyte: 46.1 %
WBC: 5.4 10*3/uL (ref 3.8–10.8)
WBCMIX: 394 {cells}/uL (ref 200–950)

## 2016-11-17 LAB — FOLLICLE STIMULATING HORMONE: FSH: 33.8 m[IU]/mL

## 2016-11-17 LAB — URINALYSIS
BILIRUBIN URINE: NEGATIVE
Hgb urine dipstick: NEGATIVE
Ketones, ur: NEGATIVE
Leukocytes, UA: NEGATIVE
NITRITE: NEGATIVE
PH: 5.5 (ref 5.0–8.0)
SPECIFIC GRAVITY, URINE: 1.029 (ref 1.001–1.03)

## 2016-11-17 LAB — HCG, QUANTITATIVE, PREGNANCY: HCG, Total, QN: 3 m[IU]/mL

## 2016-11-17 LAB — HEMOGLOBIN A1C
Hgb A1c MFr Bld: 6.5 % of total Hgb — ABNORMAL HIGH (ref <5.7)
MEAN PLASMA GLUCOSE: 140 (calc)
eAG (mmol/L): 7.7 (calc)

## 2016-11-17 LAB — LIPID PANEL
Cholesterol: 150 mg/dL (ref <200)
HDL: 36 mg/dL — ABNORMAL LOW (ref >50)
LDL Cholesterol (Calc): 96 mg/dL (calc)
NON-HDL CHOLESTEROL (CALC): 114 mg/dL (ref <130)
TRIGLYCERIDES: 90 mg/dL (ref <150)
Total CHOL/HDL Ratio: 4.2 (calc) (ref <5.0)

## 2016-11-17 LAB — URINE CYTOLOGY ANCILLARY ONLY
Chlamydia: NEGATIVE
Neisseria Gonorrhea: NEGATIVE

## 2016-11-17 LAB — RPR: RPR Ser Ql: NONREACTIVE

## 2016-11-22 ENCOUNTER — Other Ambulatory Visit: Payer: Self-pay | Admitting: Infectious Diseases

## 2016-11-22 DIAGNOSIS — D750 Familial erythrocytosis: Secondary | ICD-10-CM

## 2016-11-22 DIAGNOSIS — I10 Essential (primary) hypertension: Secondary | ICD-10-CM

## 2016-11-22 DIAGNOSIS — E1169 Type 2 diabetes mellitus with other specified complication: Secondary | ICD-10-CM

## 2016-11-22 DIAGNOSIS — E119 Type 2 diabetes mellitus without complications: Secondary | ICD-10-CM | POA: Insufficient documentation

## 2016-11-22 MED ORDER — METFORMIN HCL 500 MG PO TABS: 500.0000 mg | ORAL_TABLET | Freq: Every day | ORAL | 2 refills | Status: AC

## 2016-11-22 MED ORDER — METFORMIN HCL 500 MG PO TABS: 500.0000 mg | ORAL_TABLET | Freq: Every day | ORAL | 2 refills | Status: DC

## 2016-11-22 MED ORDER — LISINOPRIL-HYDROCHLOROTHIAZIDE 10-12.5 MG PO TABS: 1.0000 | ORAL_TABLET | Freq: Every day | ORAL | 2 refills | Status: DC

## 2016-11-22 MED ORDER — LISINOPRIL-HYDROCHLOROTHIAZIDE 10-12.5 MG PO TABS: 1.0000 | ORAL_TABLET | Freq: Every day | ORAL | 2 refills | Status: AC

## 2016-11-22 NOTE — BH Specialist Note (Signed)
Integrated Behavioral Health Initial Visit  MRN: [id number] Name: Holly Hunt  Number of Integrated Behavioral Health Clinician visits:: 1/6 Session Start time: 10:30 am  Session End time: 10:50 am Total time: 20 minutes  Type of Service: Integrated Behavioral Health- Individual/Family Interpretor:No. Interpretor Name and Language: N/A   Warm Hand Off Completed.       SUBJECTIVE: Holly Hunt is a 51 y.o. female accompanied by self Patient was referred by Rexene Alberts for history of mental health treatment and medications.  Patient reports the following symptoms/concerns: Depressive symptoms: Mood is "content", no appetite changes, anhedonia, fatigue, and feelings of worthlessness.  Patient reported past suicide attempts, with the most recent being in 2009 where she walked out into traffic and the police came.  Patient denied current suicidal ideations, plan, or intent.  Manic symptoms: Patient denied current reckless or impulsive behaviors, reported fleeting distractibility, an increase in goal-directed behavior, racing thoughts, physical restlessness and feeling being driven by a motor, rambling hyperverbal speech, and operating on 4 hours of sleep a night.     Patient reported that she was HIV diagnosed in 2010 and has been taking medication since 2011.  Patient denies any adjustment issues with her diagnosis.  Patient reported that she was provided with a diagnosis of Depression from Dr. Audrie Lia in 2000 and has received both treatment and medications to manage her symptoms.  Patient reported that she was prescribed Seroquel and has taken for the past 10 years, but denied current medication compliance.  Patient reported that she last took medications 6 months ago while she was incarcerated.  Patient reported that the medications were helpful to her and she would like to take them again.  [facility] educated patient that certain medications the clinic providers may prescribe, but others they may  want prescribed and managed by a psychiatrist.  Patient was receptive to this.  Patient reported that she has heard of [facility] and [facility] and was not willing to be referred to [facility].  Patient was familiar with location of [facility] on [address] and was referred there to walk in for assessment.  Severity of problem: moderate  OBJECTIVE: Mood: elevated and Affect: within range Risk of harm to self or others: No plan to harm self or others  LIFE CONTEXT: Family and Social: Patient was born in [place] and moved to [place] for awhile then came back to [place] one year ago.  Patient lives by herself and has grown children and six grandchildren that she moved back to be closer to.  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 Self-Care: Patient is able to tend to her ADL's and remain treatment compliant. Life Changes: Patient moved back to [place] one year ago.  ASSESSMENT: Patient is currently experiencing unmanaged symptoms of Bipolar Disorder and may benefit from medication management and psychological eval and behavioral health services.   GOALS ADDRESSED: Patient will: 1. Reduce symptoms of: depression and hypomania 2. Increase knowledge and/or ability of: coping skills, healthy habits and self-management skills  3. Demonstrate ability to: Increase healthy adjustment to current life circumstances and Increase adequate support systems for patient/family  INTERVENTIONS: Interventions utilized: Supportive Counseling and Link to [facility]   PLAN: 1. Referral(s): Paramedic (LME/Outside Clinic) and Psychiatrist to [facility] for walk-in assessment for medication management and behavioral health services 2. Patient will call [facility] if additional assistance is needed.  Vergia Alberts, [facility]

## 2016-11-27 ENCOUNTER — Telehealth: Payer: Self-pay

## 2016-11-27 NOTE — Telephone Encounter (Signed)
Per verbal order by Rexene Alberts I attempted to call Holly Hunt. Just wanted to inform her that her kidney function looks good and can resume taking her previous blood pressure medication Lisinopril and Hydrochlorothiazide. She appears to be diabetic her Hemoglobin A1C was 6.5%. Her cholesterol was okay. Holly Hunt would like for patient to begin Metformin once a day for now. Her prescriptions were sent to the pharmacy and are ready to pick up. Holly Hunt would also like for Holly Hunt to be referred to [facility] for Primary Care Management.  Holly Hunt, [place]

## 2016-11-28 ENCOUNTER — Encounter: Payer: Self-pay | Admitting: Infectious Diseases

## 2016-11-28 LAB — HIV-1 RNA ULTRAQUANT REFLEX TO GENTYP+
HIV 1 RNA QUANT: 714 {copies}/mL — AB
HIV-1 RNA QUANT, LOG: 2.85 {Log_copies}/mL — AB

## 2016-11-28 LAB — RFLX HIV-1 INTEGRASE GENOTYPE: HIV-1 Genotype: DETECTED — AB

## 2016-12-04 ENCOUNTER — Encounter: Payer: Self-pay | Admitting: Pediatric Intensive Care

## 2016-12-18 ENCOUNTER — Ambulatory Visit: Payer: PRIVATE HEALTH INSURANCE | Admitting: Infectious Diseases

## 2017-01-03 NOTE — Congregational Nurse Program (Signed)
Congregational Nurse Program Note  Date of Encounter: 12/04/2016  Past Medical History: Past Medical History:  Diagnosis Date  . Bipolar disorder with moderate depression (HCC) 09/09/2014  . HIV disease (HCC) 07/07/2014  . Hypertension   . Major depression, recurrent (HCC) 07/07/2014  . Osteoarthritis, knee     Encounter Details: Client states she has an ID appointment tomorrow and needs bus passes. CN checked client appointment and it is not until until 10/22. Client advised to return to CN office closer to appointment date for transportation assistance.

## 2017-01-11 ENCOUNTER — Ambulatory Visit: Payer: PRIVATE HEALTH INSURANCE | Admitting: Infectious Diseases

## 2017-03-21 IMAGING — CR DG KNEE COMPLETE 4+V*R*
5 series · 5 of 5 positions shown · non-contrast
Comparison: Plain films of the right knee 12/21/2013.

CLINICAL DATA: Chronic knee pain with acute worsening 10/25/2014.
No known injury. Subsequent encounter.

EXAM:
RIGHT KNEE - COMPLETE 4+ VIEW

[t knee ap right]
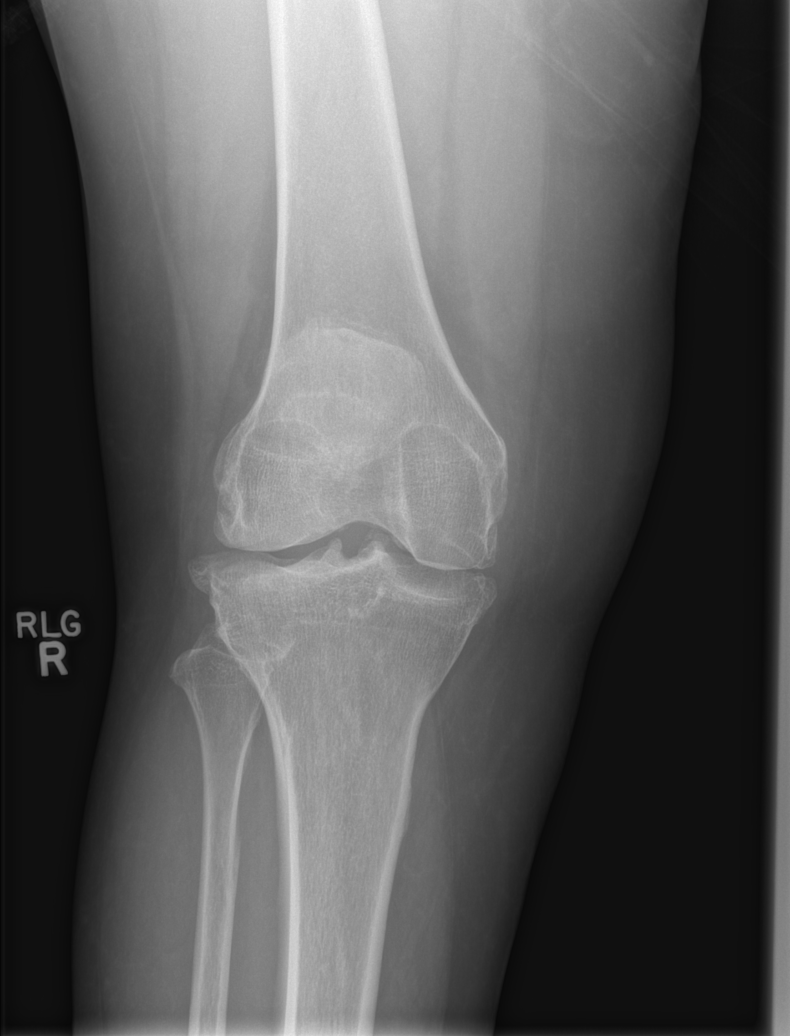

[t knee obl right (1 of 2)]
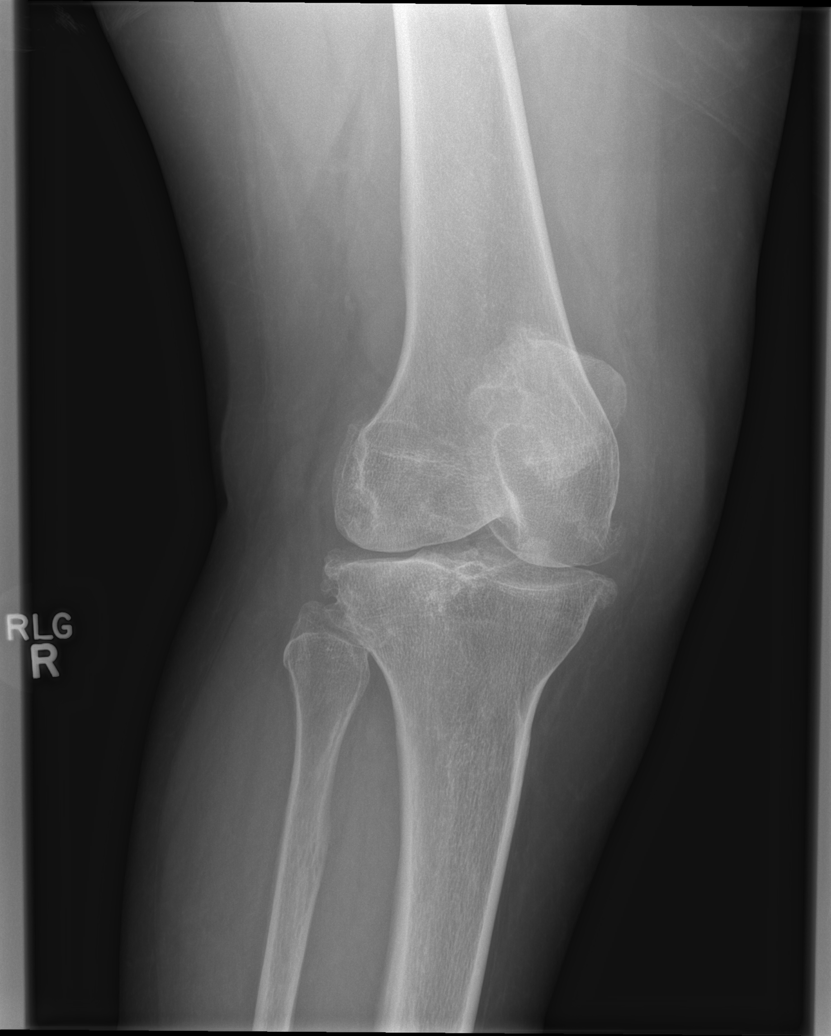

[t knee obl right (2 of 2)]
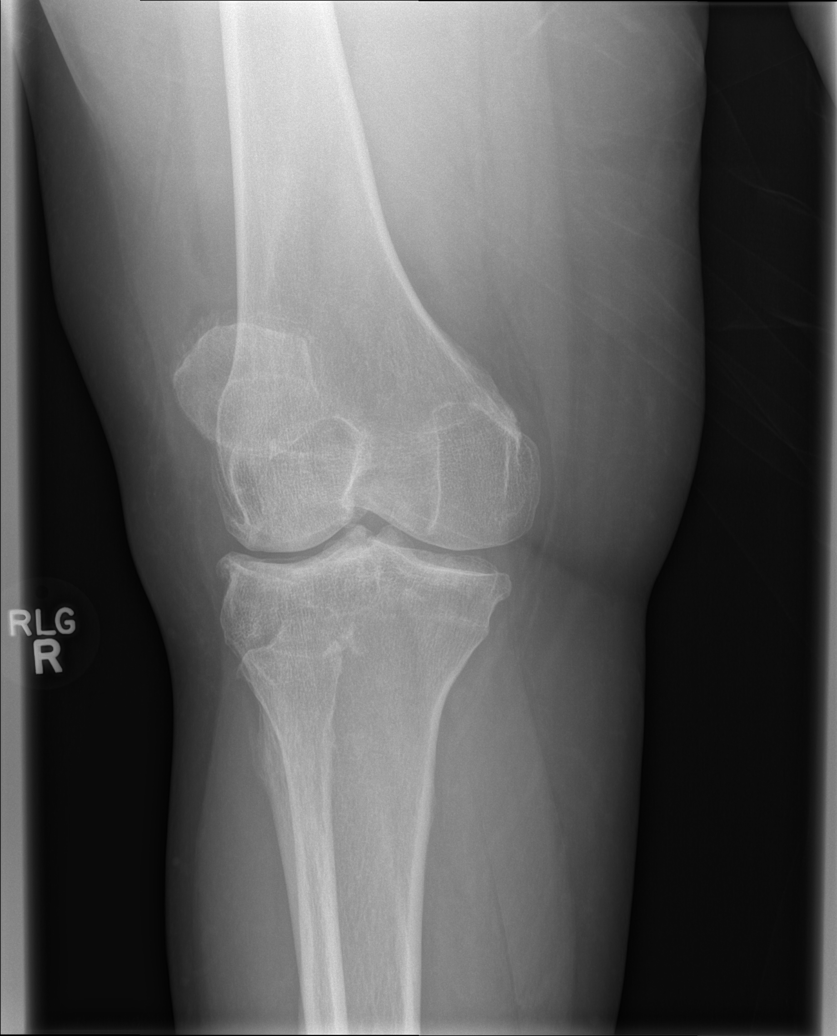

[t knee lat right (1 of 2)]
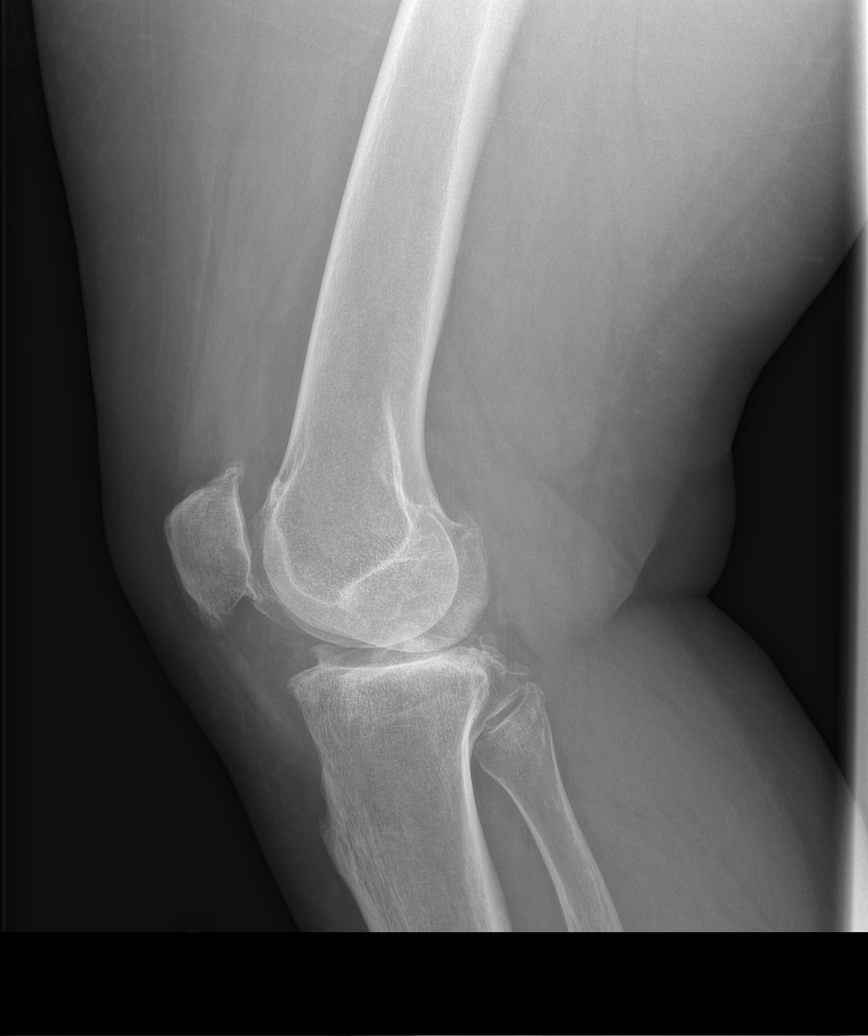

[t knee lat right (2 of 2)]
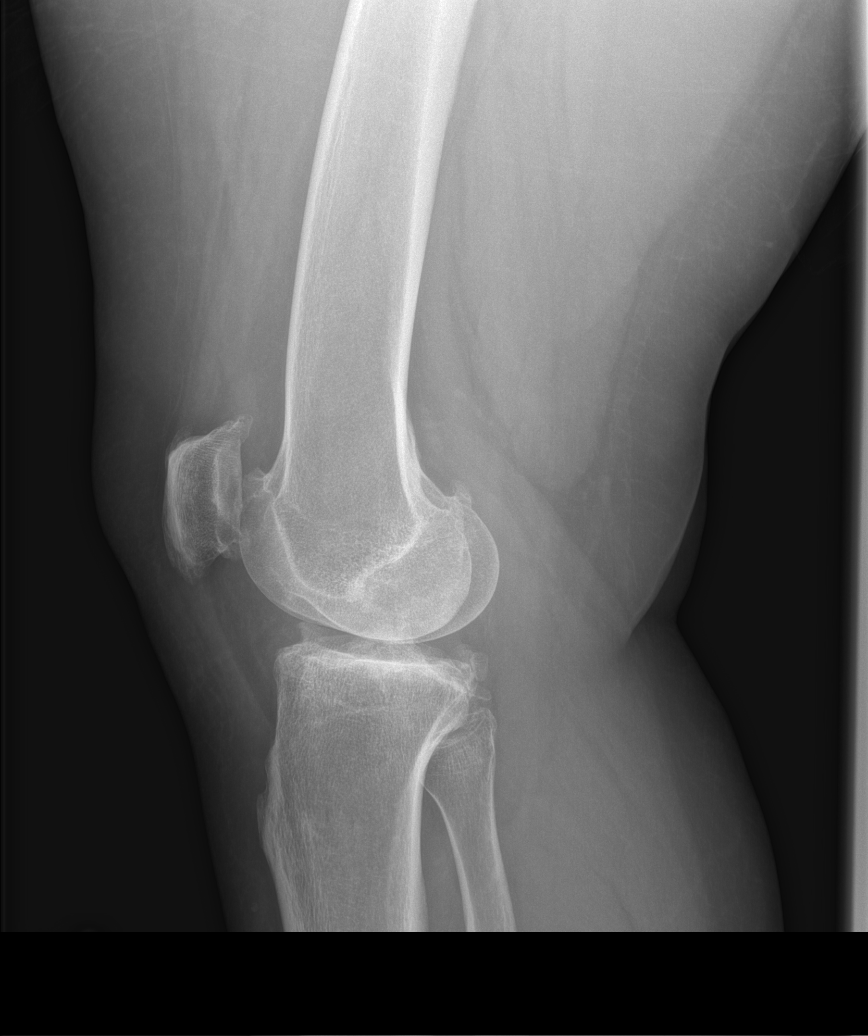

[5 of 5 positions shown; findings below may reference images not displayed]

FINDINGS: No acute bony or joint abnormality is identified. Tricompartmental
osteoarthritis with osteophytosis appearing worst laterally is
unchanged. Small joint effusion is noted. No focal bony lesion.
IMPRESSION: No acute abnormality.

Advanced for age tricompartmental osteoarthritis.

## 2017-10-03 ENCOUNTER — Telehealth: Payer: Self-pay | Admitting: *Deleted

## 2017-10-03 NOTE — Telephone Encounter (Signed)
Pt on viral load list. RN reached out to her, but call could not be completed.  RN called [facility] for fill pattern. She filled Biktarvy 03/2017- 07/2017, but then stopped. Per [facility], patient said she was moving to [place], was establishing care there.  RN accessed Care Everywhere, noted patient has been [facility], but has not followed ID there. [facility]. Andree CossHowell, Branden Vine M, RN
